# Patient Record
Sex: Female | Born: 1970 | Race: White | Hispanic: No | Marital: Married | Smoking: Never smoker
Health system: Southern US, Community
[De-identification: ages and names within clinical notes are randomized; demographics above are authoritative.]

## PROBLEM LIST (undated history)

## (undated) ENCOUNTER — Inpatient Hospital Stay (HOSPITAL_COMMUNITY): Payer: Self-pay

## (undated) DIAGNOSIS — N970 Female infertility associated with anovulation: Secondary | ICD-10-CM

## (undated) DIAGNOSIS — I10 Essential (primary) hypertension: Secondary | ICD-10-CM

## (undated) DIAGNOSIS — O24419 Gestational diabetes mellitus in pregnancy, unspecified control: Secondary | ICD-10-CM

## (undated) HISTORY — PX: DILATION AND CURETTAGE OF UTERUS: SHX78

## (undated) HISTORY — PX: TONSILLECTOMY AND ADENOIDECTOMY: SUR1326

## (undated) HISTORY — PX: INNER EAR SURGERY: SHX679

## (undated) HISTORY — DX: Female infertility associated with anovulation: N97.0

## (undated) HISTORY — DX: Gestational diabetes mellitus in pregnancy, unspecified control: O24.419

## (undated) HISTORY — PX: CHOLECYSTECTOMY: SHX55

---

## 2005-06-12 ENCOUNTER — Other Ambulatory Visit: Admission: RE | Admit: 2005-06-12 | Discharge: 2005-06-12 | Payer: Self-pay | Admitting: Obstetrics & Gynecology

## 2008-06-03 ENCOUNTER — Ambulatory Visit: Payer: Self-pay | Admitting: Physician Assistant

## 2008-06-17 ENCOUNTER — Encounter: Payer: Self-pay | Admitting: Physician Assistant

## 2008-06-17 LAB — CONVERTED CEMR LAB
ALT: 11 units/L (ref 0–35)
Albumin: 3.9 g/dL (ref 3.5–5.2)
Antibody Screen: NEGATIVE
Basophils Absolute: 0 10*3/uL (ref 0.0–0.1)
Basophils Relative: 0 % (ref 0–1)
CO2: 17 meq/L — ABNORMAL LOW (ref 19–32)
Calcium: 9.4 mg/dL (ref 8.4–10.5)
Chloride: 108 meq/L (ref 96–112)
Creatinine, Urine: 109.5 mg/dL
Eosinophils Absolute: 0.1 10*3/uL (ref 0.0–0.7)
Eosinophils Relative: 1 % (ref 0–5)
Glucose, Bld: 106 mg/dL — ABNORMAL HIGH (ref 70–99)
Hemoglobin: 14.1 g/dL (ref 12.0–15.0)
MCHC: 35 g/dL (ref 30.0–36.0)
MCV: 86.3 fL (ref 78.0–100.0)
Monocytes Absolute: 0.5 10*3/uL (ref 0.1–1.0)
Monocytes Relative: 6 % (ref 3–12)
Neutro Abs: 5.5 10*3/uL (ref 1.7–7.7)
Potassium: 4 meq/L (ref 3.5–5.3)
Protein, Ur: 84 mg/24hr (ref 50–100)
RBC: 4.67 M/uL (ref 3.87–5.11)
RDW: 14.1 % (ref 11.5–15.5)
Sodium: 136 meq/L (ref 135–145)
TSH: 1.12 microintl units/mL (ref 0.350–4.50)
Total Bilirubin: 0.5 mg/dL (ref 0.3–1.2)
Total Protein: 6.9 g/dL (ref 6.0–8.3)
Uric Acid, Serum: 4.5 mg/dL (ref 2.4–7.0)

## 2008-06-18 ENCOUNTER — Encounter: Payer: Self-pay | Admitting: Physician Assistant

## 2008-07-01 ENCOUNTER — Ambulatory Visit: Payer: Self-pay | Admitting: Physician Assistant

## 2008-07-01 LAB — CONVERTED CEMR LAB
Hemoglobin, Urine: NEGATIVE
Leukocytes, UA: NEGATIVE
Nitrite: NEGATIVE
Urine Glucose: NEGATIVE mg/dL
pH: 7 (ref 5.0–8.0)

## 2008-07-04 ENCOUNTER — Encounter: Payer: Self-pay | Admitting: Physician Assistant

## 2008-07-12 ENCOUNTER — Encounter: Payer: Self-pay | Admitting: Physician Assistant

## 2008-07-19 ENCOUNTER — Encounter: Admission: RE | Admit: 2008-07-19 | Discharge: 2008-07-19 | Payer: Self-pay | Admitting: Obstetrics & Gynecology

## 2008-07-29 ENCOUNTER — Ambulatory Visit: Payer: Self-pay | Admitting: Physician Assistant

## 2008-08-09 ENCOUNTER — Ambulatory Visit (HOSPITAL_COMMUNITY): Admission: RE | Admit: 2008-08-09 | Discharge: 2008-08-09 | Payer: Self-pay | Admitting: Obstetrics & Gynecology

## 2008-08-22 ENCOUNTER — Ambulatory Visit: Payer: Self-pay | Admitting: Family

## 2008-08-29 ENCOUNTER — Ambulatory Visit: Payer: Self-pay | Admitting: Obstetrics and Gynecology

## 2008-09-05 ENCOUNTER — Ambulatory Visit: Payer: Self-pay | Admitting: Obstetrics and Gynecology

## 2008-09-09 ENCOUNTER — Ambulatory Visit (HOSPITAL_COMMUNITY): Admission: RE | Admit: 2008-09-09 | Discharge: 2008-09-09 | Payer: Self-pay | Admitting: Obstetrics & Gynecology

## 2008-09-23 ENCOUNTER — Ambulatory Visit: Payer: Self-pay | Admitting: Physician Assistant

## 2008-10-07 ENCOUNTER — Ambulatory Visit (HOSPITAL_COMMUNITY): Admission: RE | Admit: 2008-10-07 | Discharge: 2008-10-07 | Payer: Self-pay | Admitting: Obstetrics & Gynecology

## 2008-10-07 ENCOUNTER — Ambulatory Visit: Payer: Self-pay | Admitting: Physician Assistant

## 2008-10-13 ENCOUNTER — Encounter: Payer: Self-pay | Admitting: Physician Assistant

## 2008-10-13 LAB — CONVERTED CEMR LAB
ALT: <8 units/L (ref 0–35)
AST: 12 units/L (ref 0–37)
Albumin: 3.3 g/dL — ABNORMAL LOW (ref 3.5–5.2)
BUN: 8 mg/dL (ref 6–23)
CO2: 20 meq/L (ref 19–32)
Calcium: 8.7 mg/dL (ref 8.4–10.5)
Chloride: 109 meq/L (ref 96–112)
Hemoglobin: 11.9 g/dL — ABNORMAL LOW (ref 12.0–15.0)
Platelets: 204 10*3/uL (ref 150–400)
Potassium: 4 meq/L (ref 3.5–5.3)
RDW: 14.7 % (ref 11.5–15.5)
Uric Acid, Serum: 4 mg/dL (ref 2.4–7.0)
WBC: 8.5 10*3/uL (ref 4.0–10.5)

## 2008-10-28 ENCOUNTER — Ambulatory Visit: Payer: Self-pay | Admitting: Family

## 2008-10-28 LAB — CONVERTED CEMR LAB
HCT: 36.9 % (ref 36.0–46.0)
Hemoglobin: 12 g/dL (ref 12.0–15.0)
MCV: 88.5 fL (ref 78.0–100.0)
RBC: 4.17 M/uL (ref 3.87–5.11)
WBC: 8.6 10*3/uL (ref 4.0–10.5)

## 2008-11-04 ENCOUNTER — Ambulatory Visit (HOSPITAL_COMMUNITY): Admission: RE | Admit: 2008-11-04 | Discharge: 2008-11-04 | Payer: Self-pay | Admitting: Obstetrics & Gynecology

## 2008-11-14 ENCOUNTER — Ambulatory Visit: Payer: Self-pay | Admitting: Family

## 2008-11-18 ENCOUNTER — Ambulatory Visit: Payer: Self-pay | Admitting: Obstetrics and Gynecology

## 2008-11-23 ENCOUNTER — Ambulatory Visit: Payer: Self-pay | Admitting: Obstetrics & Gynecology

## 2008-11-28 ENCOUNTER — Ambulatory Visit: Payer: Self-pay | Admitting: Family

## 2008-12-02 ENCOUNTER — Ambulatory Visit (HOSPITAL_COMMUNITY): Admission: RE | Admit: 2008-12-02 | Discharge: 2008-12-02 | Payer: Self-pay | Admitting: Obstetrics & Gynecology

## 2008-12-02 ENCOUNTER — Ambulatory Visit: Payer: Self-pay | Admitting: Family

## 2008-12-05 ENCOUNTER — Ambulatory Visit: Payer: Self-pay | Admitting: Family

## 2008-12-09 ENCOUNTER — Ambulatory Visit: Payer: Self-pay | Admitting: Obstetrics and Gynecology

## 2008-12-12 ENCOUNTER — Encounter: Payer: Self-pay | Admitting: Obstetrics and Gynecology

## 2008-12-12 ENCOUNTER — Ambulatory Visit: Payer: Self-pay | Admitting: Obstetrics & Gynecology

## 2008-12-21 ENCOUNTER — Ambulatory Visit: Payer: Self-pay | Admitting: Obstetrics & Gynecology

## 2008-12-21 ENCOUNTER — Encounter: Payer: Self-pay | Admitting: Obstetrics and Gynecology

## 2008-12-21 LAB — CONVERTED CEMR LAB
BUN: 8 mg/dL (ref 6–23)
CO2: 19 meq/L (ref 19–32)
Creatinine, Ser: 0.58 mg/dL (ref 0.40–1.20)
Glucose, Bld: 104 mg/dL — ABNORMAL HIGH (ref 70–99)
HCT: 39.9 % (ref 36.0–46.0)
Hemoglobin: 13.3 g/dL (ref 12.0–15.0)
MCV: 90.5 fL (ref 78.0–100.0)
RBC: 4.41 M/uL (ref 3.87–5.11)
Sodium: 140 meq/L (ref 135–145)
Total Bilirubin: 0.4 mg/dL (ref 0.3–1.2)
Total Protein: 6 g/dL (ref 6.0–8.3)
WBC: 9.8 10*3/uL (ref 4.0–10.5)

## 2008-12-23 ENCOUNTER — Ambulatory Visit: Payer: Self-pay | Admitting: Obstetrics and Gynecology

## 2008-12-23 ENCOUNTER — Encounter: Payer: Self-pay | Admitting: Obstetrics & Gynecology

## 2008-12-23 ENCOUNTER — Ambulatory Visit (HOSPITAL_COMMUNITY): Admission: RE | Admit: 2008-12-23 | Discharge: 2008-12-23 | Payer: Self-pay | Admitting: Obstetrics & Gynecology

## 2008-12-23 LAB — CONVERTED CEMR LAB
Collection Interval-CRCL: 24 hr
Creatinine, Urine: 100.8 mg/dL
Protein, Ur: 85 mg/24hr (ref 50–100)

## 2008-12-26 ENCOUNTER — Ambulatory Visit: Payer: Self-pay | Admitting: Family

## 2008-12-30 ENCOUNTER — Ambulatory Visit: Payer: Self-pay | Admitting: Obstetrics and Gynecology

## 2009-01-03 ENCOUNTER — Ambulatory Visit: Payer: Self-pay | Admitting: Family Medicine

## 2009-01-03 ENCOUNTER — Inpatient Hospital Stay (HOSPITAL_COMMUNITY): Admission: RE | Admit: 2009-01-03 | Discharge: 2009-01-05 | Payer: Self-pay | Admitting: Family Medicine

## 2009-01-06 ENCOUNTER — Ambulatory Visit: Payer: Self-pay | Admitting: Physician Assistant

## 2009-01-10 ENCOUNTER — Ambulatory Visit: Payer: Self-pay | Admitting: Obstetrics & Gynecology

## 2009-01-20 ENCOUNTER — Ambulatory Visit: Payer: Self-pay | Admitting: Physician Assistant

## 2009-01-21 ENCOUNTER — Encounter: Payer: Self-pay | Admitting: Physician Assistant

## 2009-02-17 ENCOUNTER — Ambulatory Visit: Payer: Self-pay | Admitting: Physician Assistant

## 2009-03-16 ENCOUNTER — Encounter: Payer: Self-pay | Admitting: Physician Assistant

## 2009-08-25 ENCOUNTER — Emergency Department (HOSPITAL_BASED_OUTPATIENT_CLINIC_OR_DEPARTMENT_OTHER): Admission: EM | Admit: 2009-08-25 | Discharge: 2009-08-25 | Payer: Self-pay | Admitting: Emergency Medicine

## 2010-09-24 ENCOUNTER — Ambulatory Visit: Admit: 2010-09-24 | Payer: Self-pay | Admitting: Obstetrics and Gynecology

## 2010-11-03 IMAGING — US US OB FOLLOW-UP
1 series · 18 of 28 positions shown · non-contrast
Comparison: none

OBSTETRICAL ULTRASOUND:
 This ultrasound was performed in The [HOSPITAL], and the [facility] report will be stored to [REDACTED] PACS.

[Series 1: us ob follow-up · 18 of 57 slices shown]
[im 1/57]
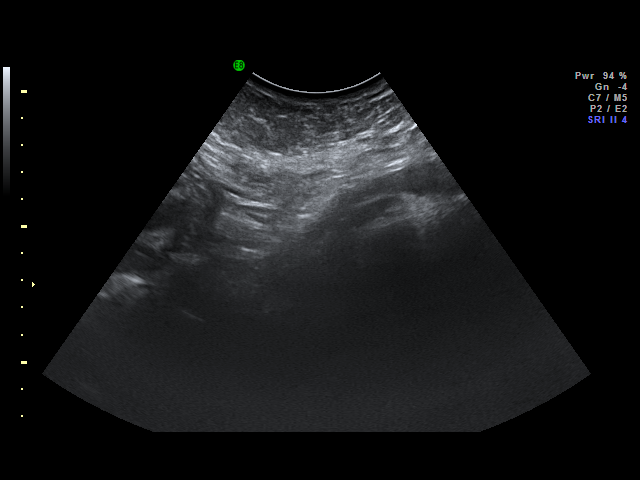
[im 5/57]
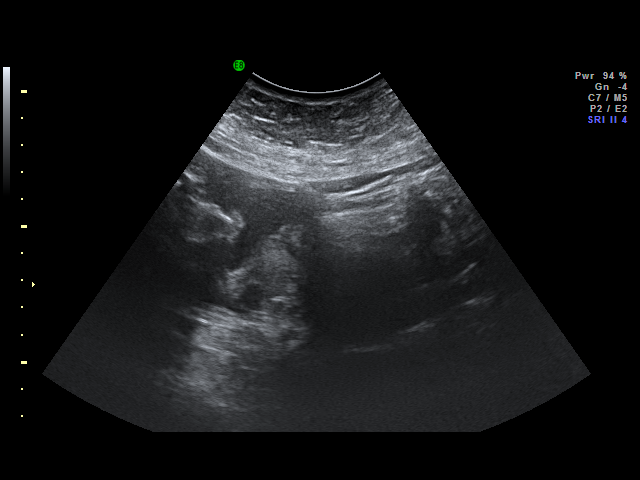
[im 7/57]
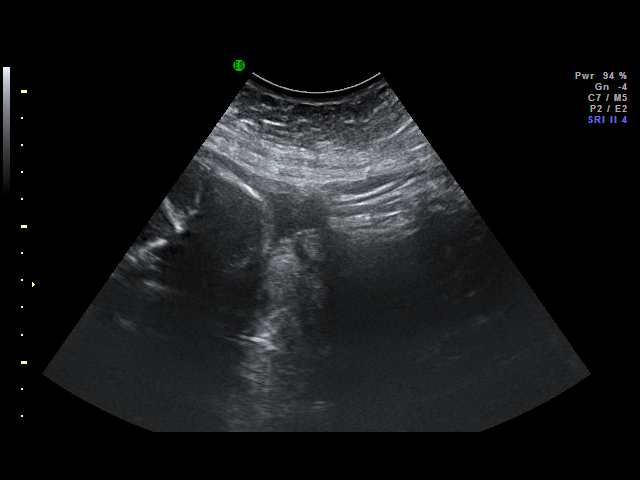
[im 11/57]
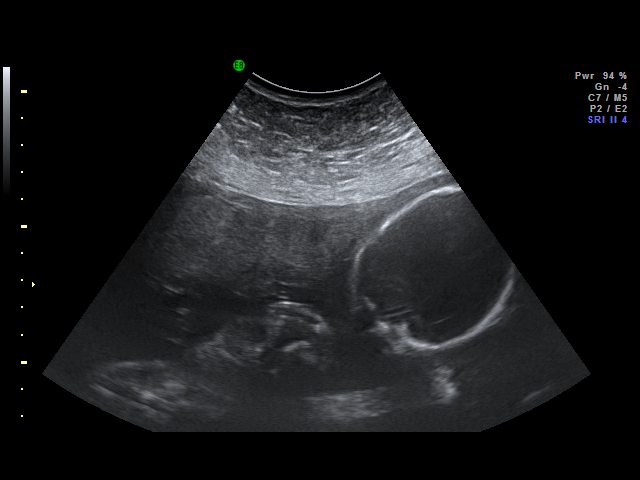
[im 15/57]
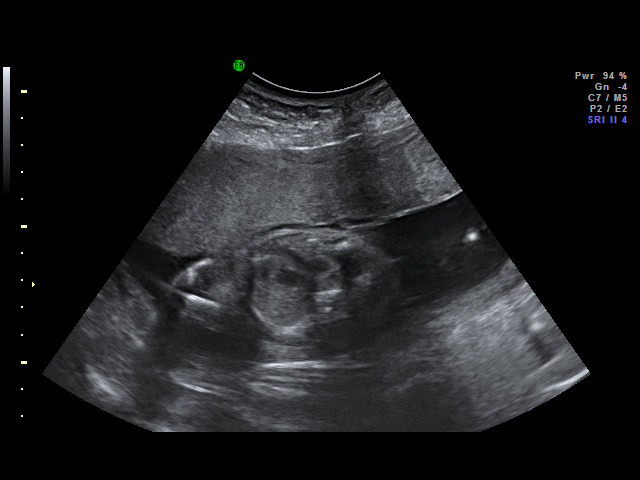
[im 17/57]
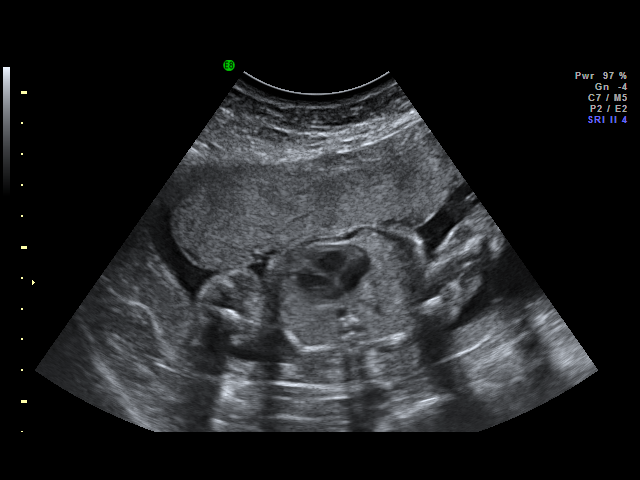
[im 21/57]
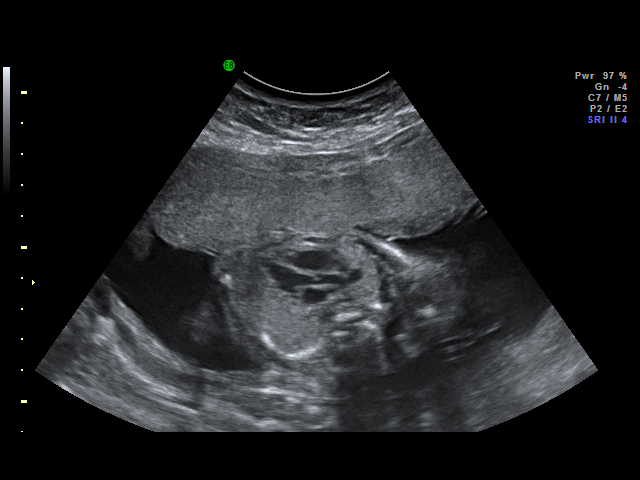
[im 23/57]
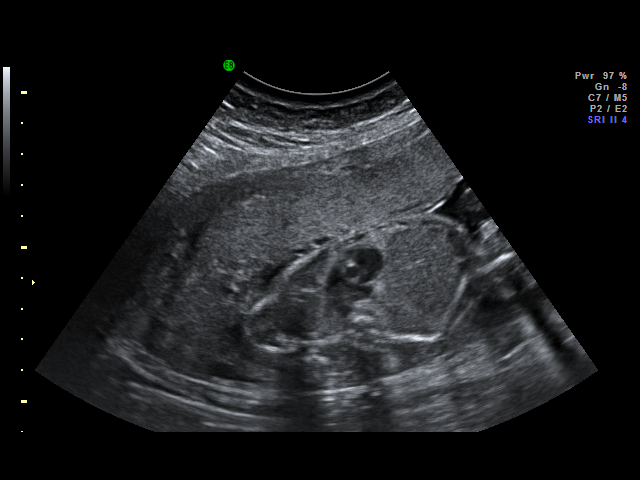
[im 27/57]
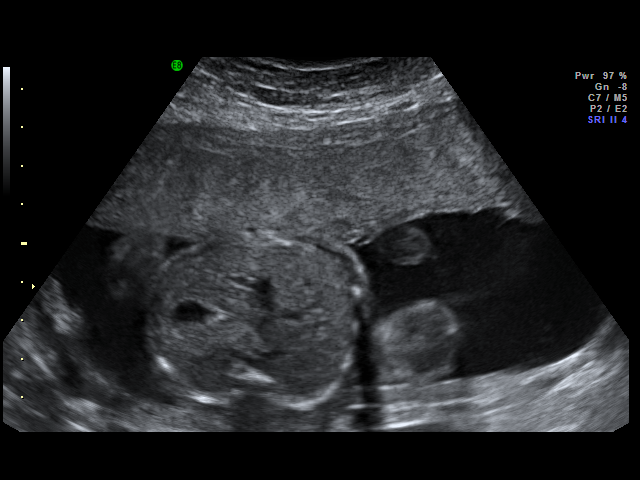
[im 30/57]
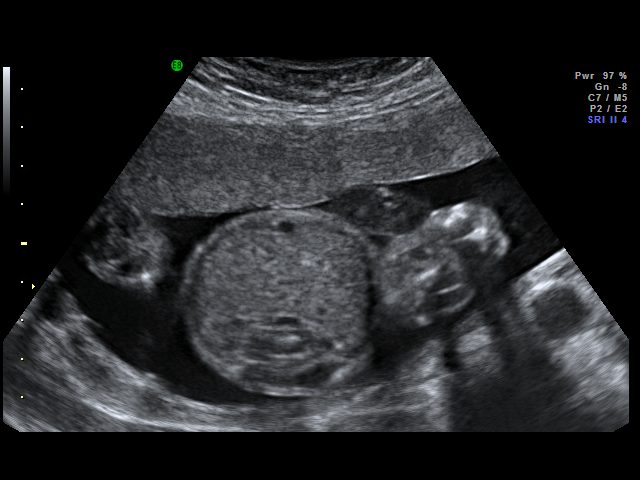
[im 34/57]
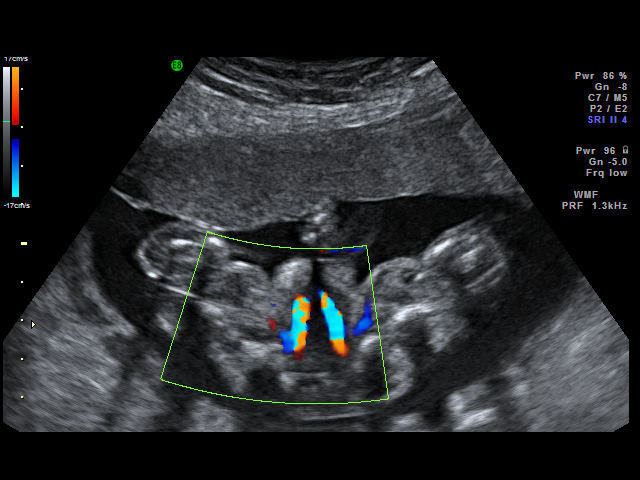
[im 36/57]
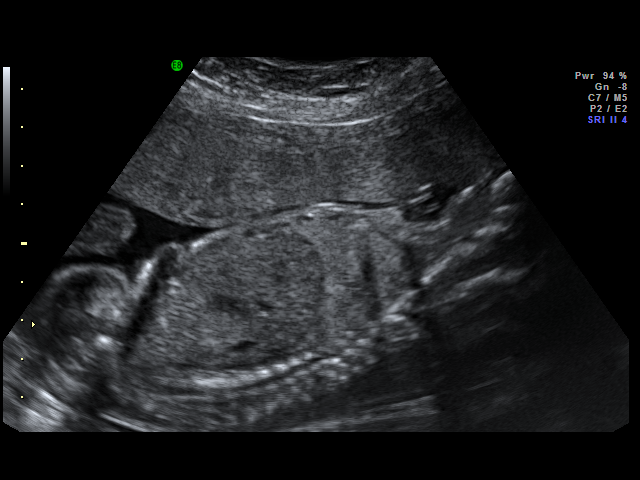
[im 40/57]
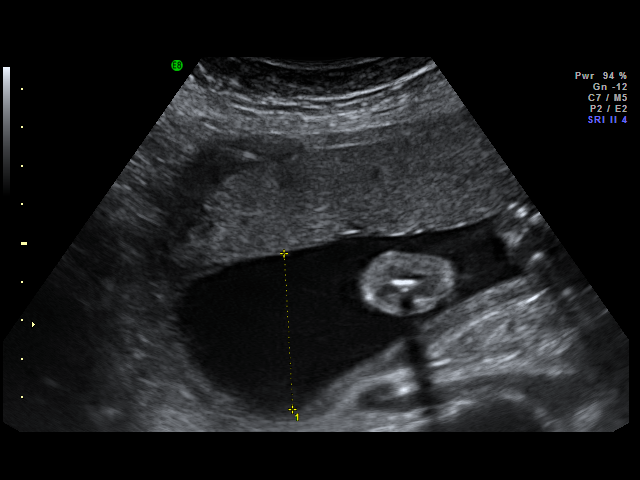
[im 44/57]
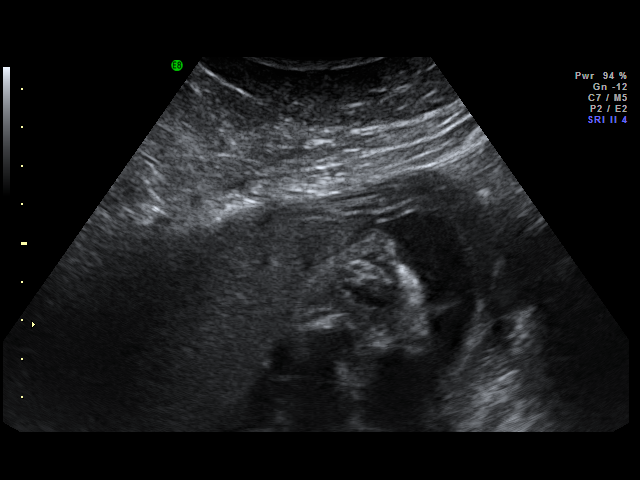
[im 46/57]
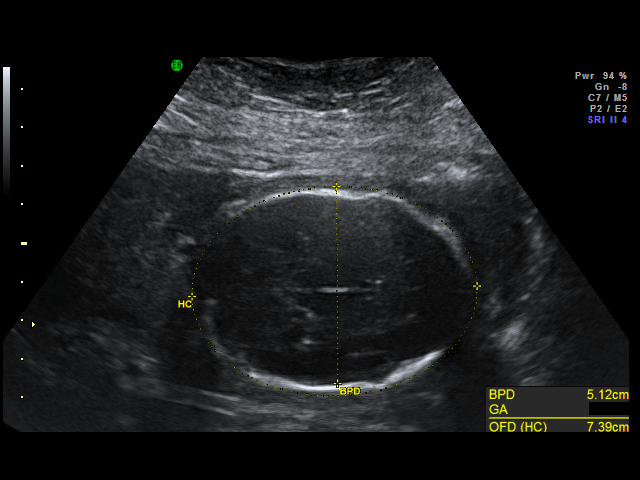
[im 50/57]
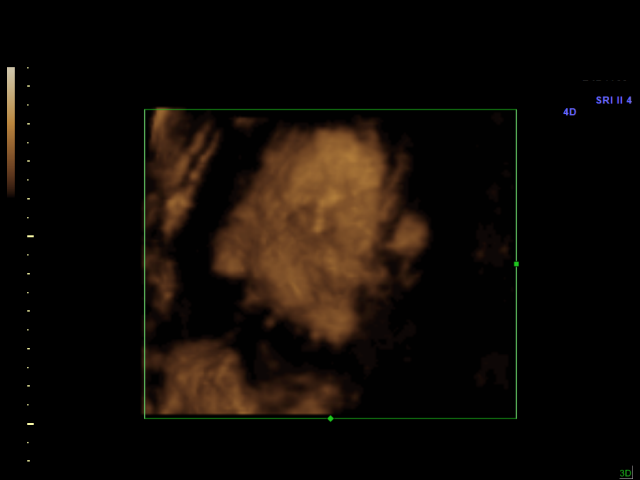
[im 52/57]
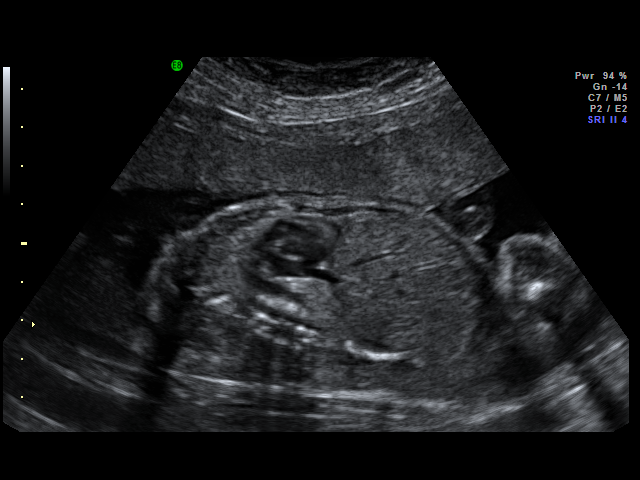
[im 57/57]
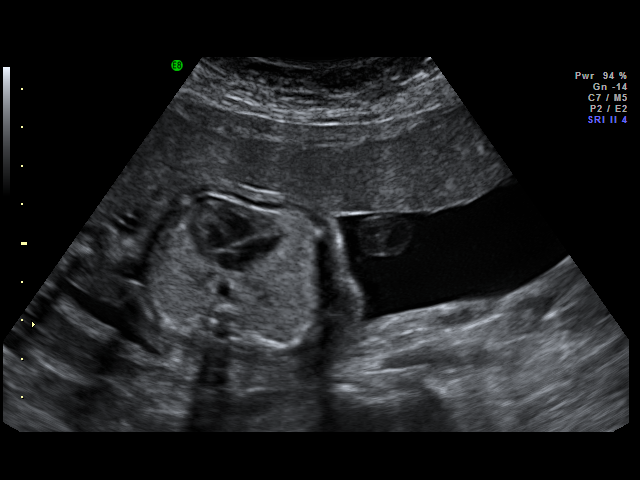

[18 of 28 positions shown; findings below may reference images not displayed]

IMPRESSION: [facility] has also been faxed to the ordering physician.

## 2010-12-04 LAB — GLUCOSE, CAPILLARY
Glucose-Capillary: 144 mg/dL — ABNORMAL HIGH (ref 70–99)
Glucose-Capillary: 93 mg/dL (ref 70–99)
Glucose-Capillary: 94 mg/dL (ref 70–99)
Glucose-Capillary: 99 mg/dL (ref 70–99)

## 2010-12-04 LAB — CBC
Hemoglobin: 12.9 g/dL (ref 12.0–15.0)
Hemoglobin: 13.5 g/dL (ref 12.0–15.0)
MCHC: 35.2 g/dL (ref 30.0–36.0)
RBC: 4.07 MIL/uL (ref 3.87–5.11)
RBC: 4.25 MIL/uL (ref 3.87–5.11)
WBC: 8.6 10*3/uL (ref 4.0–10.5)

## 2010-12-04 LAB — CROSSMATCH: ABO/RH(D): B POS

## 2010-12-04 LAB — BASIC METABOLIC PANEL
CO2: 19 mEq/L (ref 19–32)
Chloride: 109 mEq/L (ref 96–112)
GFR calc Af Amer: >60 mL/min (ref >60)
Potassium: 3.7 mEq/L (ref 3.5–5.1)
Sodium: 138 mEq/L (ref 135–145)

## 2011-01-08 NOTE — Assessment & Plan Note (Signed)
[name], [name]                 ACCOUNT NO.:  [id number]   MEDICAL RECORD NO.:  [id number]          PATIENT TYPE:  POB   LOCATION:  [facility] [place]         FACILITY:  [facility]   PHYSICIAN:  Elsie Lincoln, MD      DATE OF BIRTH:  07/18/1971   DATE OF SERVICE:  01/10/2009                                  CLINIC NOTE   The patient is a 40 year old female who is postpartum day #7 from a C-  section.  The patient is having a little drainage from her C-section  scar.  She is also complaining of engorgement and the baby unable to be  latching on to her breast.  In terms of her C-section scar, there is no  odor or pain.  Her abdomen is nontender.   On physical exam, her abdomen is soft, nontender.  No rebound.  No  guarding.  These Pfannenstiel skin incision has an approximately  centimeter and a half opening just a few millimeters deep.  This was  cleaned with hydrogen peroxide, and a small amount of silver nitrate was  placed at the edges to aid in healing.  The patient was instructed to  keep this area clean and dry.   For her breasts, I suggested pre-pumping so the baby can latch on better  and also icing in between to decrease milk production.  The patient  seems to be getting frustrated with having to pump everything and feed  in a bottle and hopefully __________ breast-feeding only and avoid the  bottles at this point.   The patient is to come back in 1 week to evaluate skin incision, come  back sooner if it is worsening.           ______________________________  Elsie Lincoln, MD     KL/MEDQ  D:  01/10/2009  T:  01/10/2009  Job:  [id number]

## 2011-01-08 NOTE — Discharge Summary (Signed)
NAMEJESSIE, [name]                 ACCOUNT NO.:  [id number]   MEDICAL RECORD NO.:  [id number]          PATIENT TYPE:  INP   LOCATION:  9145                          FACILITY:  [facility]   PHYSICIAN:  [name], M.D.   DATE OF BIRTH:  11/16/1970   DATE OF ADMISSION:  01/03/2009  DATE OF DISCHARGE:  01/05/2009                               DISCHARGE SUMMARY   ADMISSION DIAGNOSES:  Intrauterine pregnancy 39-0/7 weeks, class A2  gestational diabetes, previous C-section x2.   DISCHARGE DIAGNOSIS:  Repeat low transverse cervical (cesarean) section,  viable female infant, class A2 gestational diabetes mellitus.   HISTORY:  This is a 40 year old G7, P 2-0-4-2, who presented for  scheduled repeat LTCS.  Of note, she had a history of having had 4 early  SABs.  She had gestational hypertension and was breech with both C-  sections prior to this.  Her prenatal course was complicated by A2  gestational diabetes.  She was on glyburide and twice weekly fetal  testing.  Also, she had a thrombophilia workup in the pregnancy that was  negative, and due to her borderline BP elevation, she had a 24-hour  urine and CMP done which were normal.  She did early q.i.d. glucose  monitoring from second trimester and had good glycemic control on  glyburide 5 mg at bedtime.  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   FAMILY HISTORY:  Significant for CAD, chronic hypertension, COPD, and  breast cancer.   SOCIAL HISTORY:  She had a supportive family.  No alcohol or illicit  drugs.   She had no allergies.  Her medication at the time of admission was  glyburide 3.75 mg nightly, prenatal vitamins, and Zantac.  Her review of  systems was negative for headache, nausea, vomiting, fever, and  constipation.   HOSPITAL COURSE:  She underwent a scheduled LTCS with no complications,  had a viable female infant, Apgars 8 at one and 9 at  five minutes.  The  infant had some issues with low sugars, but was in regular newborn  nursery and was breast-feeding well at day of discharge.  Her EBL was  noted to be 1000 at C-section.  Her hemoglobin was 13.5 on admission and  12.9 on postop day #1.  Her postpartum CBGs were essentially within  normal range 87-144.  On day of discharge, she was normotensive at  136/85 and pulse 64.  Her abdomen was nontender essentially and fundus  was involuting.  Staples intact.  Calves were nontender.  She was  discharged in good condition on hospital day #2.   Her meds were prenatal vitamin 1 a day, ibuprofen 600 q.6 h. p.r.n.  cramps, Percocet 5/325 mg 1-2 q.4-6 h.  [facility] for staple removal on Jan 06, 2009, and also at 6 weeks she would go to [place].  At that visit,  she will undergo some glycemic testing and is  to get on some definitive  contraception since she at present elects abstinence or condoms.      [name], C.N.M.    ______________________________  [name]. Shawnie Dixon, M.D.    DP/MEDQ  D:  01/05/2009  T:  01/05/2009  Job:  [id number]

## 2011-01-08 NOTE — Op Note (Signed)
[name], [name]                 ACCOUNT NO.:  [id number]   MEDICAL RECORD NO.:  [id number]          PATIENT TYPE:  POB   LOCATION:  [facility]                          FACILITY:  [facility]   PHYSICIAN:  Tanya S. Shawnie Pons, M.D.   DATE OF BIRTH:  10/17/1970   DATE OF PROCEDURE:  01/03/2009  DATE OF DISCHARGE:                               OPERATIVE REPORT   PREOPERATIVE DIAGNOSIS:  Previous cesarean section x2, intrauterine  pregnancy at 39 weeks, A2/B diabetes mellitus, advanced maternal age,  history of fertility, history of multiple miscarriages, and  hypertension.   POSTOPERATIVE DIAGNOSES:  Previous cesarean section x2, intrauterine  pregnancy at 39 weeks, A2/B diabetes mellitus, advanced maternal age,  history of fertility, history of multiple miscarriages, and  hypertension.   PROCEDURE:  A repeat low transverse cesarean section.   SURGEON:  Shelbie Proctor. Shawnie Pons, MD   ASSISTANT:  None.   ANESTHESIA:  Spinal and local, [place].   FINDINGS:  A viable female infant, Apgars 9 and 10, weight 7 pounds 9  ounces.   SPECIMENS:  Placenta to Labor and Delivery.   ESTIMATED BLOOD LOSS:  1000 mL.   COMPLICATIONS:  None known.   REASON FOR PROCEDURE:  Briefly, the patient is a 40 year old gravida 7,  para 2, who has had 5 previous miscarriages, but has had 2 previous C-  sections at 39 weeks.  She is a A2/B diabetes, on glyburide at bedtime  with excellent blood sugar control and appropriately grand fetus.  The  patient required elective repeat C-section secondary to 2 prior sections  and no vaginal deliveries.   PROCEDURE IN DETAIL:  The patient was taken to the OR.  She was placed  in supine position in the left lateral tilt.  She was prepped and draped  in the usual sterile fashion.  A Foley catheter was placed in the  bladder.  After anesthesia was felt to be adequate by Allis testing, a  knife was used to make a Pfannenstiel incision through an old incision.  This incision was carried  down to the fascia which was divided in the  midline sharply.  This incision was extended laterally with Mayo  scissors.  Two Kocher clamps were then used to elevate the superior edge  of the fascia off the underlying rectus.  This was done bluntly,  laterally and sharply in the midline.  The peritoneal cavity was entered  here and then this incision was bluntly extended by pulling on either  side by the surgeon and assistant.  Alexis retractor was placed inside  the incision, the bladder was identified and then lower segment was also  identified that was very wide, and a low transverse incision was made on  the uterus.  Amniotic cavity was entered and an Allis clamp was used to  rupture clear fluid.  Infant was in a vertex presentation, was brought  out, nuchal cord x2 were reduced easily, infant was crying prior to  delivery.  Cord was clamped x2.  Infant was bulb suctioned on the  abdomen and given to awaiting peds.  The placenta  was delivered out of  the uterus without difficulty.  The uterus was cleaned with a dry lap  pad.  The edges of the uterine incision were identified and closed with  a 0 Vicryl suture in locked running fashion.  Tubes and ovaries were  observed to be normal.  The uterine incision was inspected, felt to be  hemostatic.  Abdomen cleared of clots.  Fascia closed with 0 Vicryl in a  running fashion.  Subcutaneous tissue irrigated, any bleeders  cauterized.  Skin closed using clips.  A 30 mL of 0.25% Marcaine were  injected about the incision.  All instrument, needle, and lap counts  were correct x2.  The patient was awakened and taken to recovery room in  stable condition.  Infant to Newborn Nursery also stable.      Shelbie Proctor. Shawnie Pons, M.D.  Electronically Signed     TSP/MEDQ  D:  01/03/2009  T:  01/03/2009  Job:  [id number]

## 2012-08-10 ENCOUNTER — Encounter: Payer: Self-pay | Admitting: Advanced Practice Midwife

## 2012-08-10 ENCOUNTER — Ambulatory Visit (INDEPENDENT_AMBULATORY_CARE_PROVIDER_SITE_OTHER): Payer: Self-pay | Admitting: Advanced Practice Midwife

## 2012-08-10 VITALS — BP 147/97 | Temp 97.1°F | Ht 60.0 in | Wt 198.0 lb

## 2012-08-10 DIAGNOSIS — O169 Unspecified maternal hypertension, unspecified trimester: Secondary | ICD-10-CM | POA: Insufficient documentation

## 2012-08-10 DIAGNOSIS — O34219 Maternal care for unspecified type scar from previous cesarean delivery: Secondary | ICD-10-CM | POA: Insufficient documentation

## 2012-08-10 DIAGNOSIS — O09299 Supervision of pregnancy with other poor reproductive or obstetric history, unspecified trimester: Secondary | ICD-10-CM | POA: Insufficient documentation

## 2012-08-10 DIAGNOSIS — Z8742 Personal history of other diseases of the female genital tract: Secondary | ICD-10-CM

## 2012-08-10 DIAGNOSIS — O3421 Maternal care for scar from previous cesarean delivery: Secondary | ICD-10-CM

## 2012-08-10 DIAGNOSIS — IMO0002 Reserved for concepts with insufficient information to code with codable children: Secondary | ICD-10-CM

## 2012-08-10 DIAGNOSIS — Z3480 Encounter for supervision of other normal pregnancy, unspecified trimester: Secondary | ICD-10-CM

## 2012-08-10 DIAGNOSIS — O09529 Supervision of elderly multigravida, unspecified trimester: Secondary | ICD-10-CM

## 2012-08-10 NOTE — Progress Notes (Signed)
p-86  This is a planned pregnancy through Dr Elesa Hacker.  She was stimulated with Follistim.  Last pap smear 1/12.  Pt is very pro-life

## 2012-08-10 NOTE — Progress Notes (Signed)
Subjective:    [name] is a [id number] [redacted]w[redacted]d being seen today for her first obstetrical visit.  Her obstetrical history is significant for advanced maternal age and history of infertility, SAB x5, and LTCS for 3 term deliveries. Patient does intend to breast feed. Pregnancy history fully reviewed.  Patient reports no complaints.  She had vaginal bleeding/spotting a few weeks ago followed by a normal U/S done at another facility.  This bleeding has resolved.  Filed Vitals:   08/10/12 0905 08/10/12 0935  BP: 147/97   Temp: 97.1 F (36.2 C)   Height:  5' (1.524 m)  Weight: 198 lb (89.812 kg)     HISTORY: OB History    Grav Para Term Preterm Abortions TAB SAB Ect Mult Living   9 3 3       3      # Outc Date GA Lbr Len/2nd Wgt Sex Del Anes PTL Lv   1 GRA 9/01           2 GRA 9/02           3 TRM 2/04 [redacted]w[redacted]d   F CCS EPI No Yes   4 GRA 1/07           Comments: D and C   5 GRA 6/07           Comments: D&C   6 TRM 7/08 [redacted]w[redacted]d   F CCS EPI No Yes   7 TRM 5/10 [redacted]w[redacted]d   F CCS EPI No Yes   8 GRA 2/12           Comments: D&C   9 CUR              Past Medical History  Diagnosis Date  . Infertility associated with anovulation   . Gestational diabetes    Past Surgical History  Procedure Date  . Dilation and curettage of uterus     x3  . Cholecystectomy   . Tonsillectomy and adenoidectomy   . Inner ear surgery    Family History  Problem Relation Age of Onset  . Asthma Mother   . COPD Mother   . Congestive Heart Failure Mother   . Hypertension Father   . Cancer - Other Paternal Grandmother     breast     Exam    Uterus:     Pelvic Exam:    Perineum: Normal Perineum   Vulva: normal   Vagina:  normal mucosa, normal discharge   pH:    Cervix: 0/long/high, posterior, neg CMT   Adnexa: normal adnexa and no mass, fullness, tenderness   Bony Pelvis: average  System: Breast:  normal appearance, no masses or tenderness   Skin: normal coloration and turgor, no rashes    Neurologic: oriented, normal, normal mood   Extremities: normal strength, tone, and muscle mass, ROM of all joints is normal   HEENT neck supple with midline trachea and thyroid without masses   Mouth/Teeth mucous membranes moist, pharynx normal without lesions and dental hygiene good   Neck supple and no masses   Cardiovascular: regular rate and rhythm   Respiratory:  appears well, vitals normal, no respiratory distress, acyanotic, normal RR, ear and throat exam is normal, neck free of mass or lymphadenopathy, chest clear, no wheezing, crepitations, rhonchi, normal symmetric air entry   Abdomen: soft, non-tender; bowel sounds normal; no masses,  no organomegaly   Urinary: urethral meatus normal      Assessment:    Pregnancy: [id number]  Patient Active Problem List  Diagnosis  . Supervision of normal IUP (intrauterine pregnancy) in multigravida  . History of cesarean delivery, currently pregnant  . History of miscarriage, currently pregnant  . History of infertility        Plan:     Initial labs drawn. Prenatal vitamins. Problem list reviewed and updated. Genetic Screening discussed First Screen, Integrated Screen, Quad Screen, Harmony, and Amniocentesis: declined.  Ultrasound discussed; fetal survey: requested.  Follow up in 4 weeks. 50% of 30 min visit spent on counseling and coordination of care.     LEFTWICH-KIRBY, Francis Doenges 08/10/2012

## 2012-08-11 LAB — OBSTETRIC PANEL
Antibody Screen: NEGATIVE
Basophils Relative: 0 % (ref 0–1)
Eosinophils Absolute: 0.1 10*3/uL (ref 0.0–0.7)
Eosinophils Relative: 1 % (ref 0–5)
HCT: 40.5 % (ref 36.0–46.0)
Hemoglobin: 13.6 g/dL (ref 12.0–15.0)
Lymphs Abs: 2.1 10*3/uL (ref 0.7–4.0)
MCH: 29.7 pg (ref 26.0–34.0)
MCHC: 33.6 g/dL (ref 30.0–36.0)
MCV: 88.4 fL (ref 78.0–100.0)
Monocytes Absolute: 0.6 10*3/uL (ref 0.1–1.0)
Monocytes Relative: 6 % (ref 3–12)
RBC: 4.58 MIL/uL (ref 3.87–5.11)
Rh Type: POSITIVE

## 2012-08-12 LAB — CULTURE, URINE COMPREHENSIVE
Colony Count: NO GROWTH
Organism ID, Bacteria: NO GROWTH

## 2012-08-21 ENCOUNTER — Telehealth: Payer: Self-pay | Admitting: *Deleted

## 2012-08-21 NOTE — Telephone Encounter (Signed)
Pt called stating that she had a small amount of brownish blood this AM.  She does have a small subchorionic hemorrhage that was noted on her U/S.  She denies having intercourse or any cramping.  She is leaving for the beach today and will return on Wed.  She is to return to the office on Thursday to f/u.  She is to stay of feet as much as possible.  If she starts to have heavy bleeding that is bright red she may opt to go to hospital.. She knows that there is nothing that can be done at this point if she does miscarries.  She is to continue her Prometrium as prescribed.

## 2012-08-27 ENCOUNTER — Other Ambulatory Visit (INDEPENDENT_AMBULATORY_CARE_PROVIDER_SITE_OTHER): Payer: BC Managed Care – PPO | Admitting: *Deleted

## 2012-08-27 DIAGNOSIS — O208 Other hemorrhage in early pregnancy: Secondary | ICD-10-CM

## 2012-08-27 NOTE — Progress Notes (Signed)
Pt here because she had some brown spotting last week.  She denies any cramping or pain.  She is currently taking her Prometrium and had a small subchorionic hemorrhage on previous U/S.  Today U/S showed very small [facility] and a very active fetal movement with FHT on U/S of 169 bpm.  Pt reassured and will follow up in 3 weeks or PRN.

## 2012-09-07 ENCOUNTER — Ambulatory Visit (INDEPENDENT_AMBULATORY_CARE_PROVIDER_SITE_OTHER): Payer: BC Managed Care – PPO | Admitting: Advanced Practice Midwife

## 2012-09-07 ENCOUNTER — Encounter: Payer: Self-pay | Admitting: Advanced Practice Midwife

## 2012-09-07 VITALS — BP 141/96 | Wt 198.0 lb

## 2012-09-07 DIAGNOSIS — Z23 Encounter for immunization: Secondary | ICD-10-CM

## 2012-09-07 DIAGNOSIS — Z3480 Encounter for supervision of other normal pregnancy, unspecified trimester: Secondary | ICD-10-CM

## 2012-09-07 NOTE — Progress Notes (Signed)
P=88 

## 2012-09-07 NOTE — Patient Instructions (Signed)
Pregnancy - Second Trimester The second trimester of pregnancy (3 to 6 months) is a period of rapid growth for you and your baby. At the end of the sixth month, your baby is about 9 inches long and weighs 1 1/2 pounds. You will begin to feel the baby move between 18 and 20 weeks of the pregnancy. This is called quickening. Weight gain is faster. A clear fluid (colostrum) may leak out of your breasts. You may feel small contractions of the womb (uterus). This is known as false labor or Braxton-Hicks contractions. This is like a practice for labor when the baby is ready to be born. Usually, the problems with morning sickness have usually passed by the end of your first trimester. Some women develop small dark blotches (called cholasma, mask of pregnancy) on their face that usually goes away after the baby is born. Exposure to the sun makes the blotches worse. Acne may also develop in some pregnant women and pregnant women who have acne, may find that it goes away. PRENATAL EXAMS  Blood work may continue to be done during prenatal exams. These tests are done to check on your health and the probable health of your baby. Blood work is used to follow your blood levels (hemoglobin). Anemia (low hemoglobin) is common during pregnancy. Iron and vitamins are given to help prevent this. You will also be checked for diabetes between 24 and 28 weeks of the pregnancy. Some of the previous blood tests may be repeated.  The size of the uterus is measured during each visit. This is to make sure that the baby is continuing to grow properly according to the dates of the pregnancy.  Your blood pressure is checked every prenatal visit. This is to make sure you are not getting toxemia.  Your urine is checked to make sure you do not have an infection, diabetes or protein in the urine.  Your weight is checked often to make sure gains are happening at the suggested rate. This is to ensure that both you and your baby are growing  normally.  Sometimes, an ultrasound is performed to confirm the proper growth and development of the baby. This is a test which bounces harmless sound waves off the baby so your caregiver can more accurately determine due dates. Sometimes, a specialized test is done on the amniotic fluid surrounding the baby. This test is called an amniocentesis. The amniotic fluid is obtained by sticking a needle into the belly (abdomen). This is done to check the chromosomes in instances where there is a concern about possible genetic problems with the baby. It is also sometimes done near the end of pregnancy if an early delivery is required. In this case, it is done to help make sure the baby's lungs are mature enough for the baby to live outside of the womb. CHANGES OCCURING IN THE SECOND TRIMESTER OF PREGNANCY Your body goes through many changes during pregnancy. They vary from person to person. Talk to your caregiver about changes you notice that you are concerned about.  During the second trimester, you will likely have an increase in your appetite. It is normal to have cravings for certain foods. This varies from person to person and pregnancy to pregnancy.  Your lower abdomen will begin to bulge.  You may have to urinate more often because the uterus and baby are pressing on your bladder. It is also common to get more bladder infections during pregnancy (pain with urination). You can help this by   drinking lots of fluids and emptying your bladder before and after intercourse.  You may begin to get stretch marks on your hips, abdomen, and breasts. These are normal changes in the body during pregnancy. There are no exercises or medications to take that prevent this change.  You may begin to develop swollen and bulging veins (varicose veins) in your legs. Wearing support hose, elevating your feet for 15 minutes, 3 to 4 times a day and limiting salt in your diet helps lessen the problem.  Heartburn may develop  as the uterus grows and pushes up against the stomach. Antacids recommended by your caregiver helps with this problem. Also, eating smaller meals 4 to 5 times a day helps.  Constipation can be treated with a stool softener or adding bulk to your diet. Drinking lots of fluids, vegetables, fruits, and whole grains are helpful.  Exercising is also helpful. If you have been very active up until your pregnancy, most of these activities can be continued during your pregnancy. If you have been less active, it is helpful to start an exercise program such as walking.  Hemorrhoids (varicose veins in the rectum) may develop at the end of the second trimester. Warm sitz baths and hemorrhoid cream recommended by your caregiver helps hemorrhoid problems.  Backaches may develop during this time of your pregnancy. Avoid heavy lifting, wear low heal shoes and practice good posture to help with backache problems.  Some pregnant women develop tingling and numbness of their hand and fingers because of swelling and tightening of ligaments in the wrist (carpel tunnel syndrome). This goes away after the baby is born.  As your breasts enlarge, you may have to get a bigger bra. Get a comfortable, cotton, support bra. Do not get a nursing bra until the last month of the pregnancy if you will be nursing the baby.  You may get a dark line from your belly button to the pubic area called the linea nigra.  You may develop rosy cheeks because of increase blood flow to the face.  You may develop spider looking lines of the face, neck, arms and chest. These go away after the baby is born. HOME CARE INSTRUCTIONS   It is extremely important to avoid all smoking, herbs, alcohol, and unprescribed drugs during your pregnancy. These chemicals affect the formation and growth of the baby. Avoid these chemicals throughout the pregnancy to ensure the delivery of a healthy infant.  Most of your home care instructions are the same as  suggested for the first trimester of your pregnancy. Keep your caregiver's appointments. Follow your caregiver's instructions regarding medication use, exercise and diet.  During pregnancy, you are providing food for you and your baby. Continue to eat regular, well-balanced meals. Choose foods such as meat, fish, milk and other low fat dairy products, vegetables, fruits, and whole-grain breads and cereals. Your caregiver will tell you of the ideal weight gain.  A physical sexual relationship may be continued up until near the end of pregnancy if there are no other problems. Problems could include early (premature) leaking of amniotic fluid from the membranes, vaginal bleeding, abdominal pain, or other medical or pregnancy problems.  Exercise regularly if there are no restrictions. Check with your caregiver if you are unsure of the safety of some of your exercises. The greatest weight gain will occur in the last 2 trimesters of pregnancy. Exercise will help you:  Control your weight.  Get you in shape for labor and delivery.  Lose weight   after you have the baby.  Wear a good support or jogging bra for breast tenderness during pregnancy. This may help if worn during sleep. Pads or tissues may be used in the bra if you are leaking colostrum.  Do not use hot tubs, steam rooms or saunas throughout the pregnancy.  Wear your seat belt at all times when driving. This protects you and your baby if you are in an accident.  Avoid raw meat, uncooked cheese, cat litter boxes and soil used by cats. These carry germs that can cause birth defects in the baby.  The second trimester is also a good time to visit your dentist for your dental health if this has not been done yet. Getting your teeth cleaned is OK. Use a soft toothbrush. Brush gently during pregnancy.  It is easier to loose urine during pregnancy. Tightening up and strengthening the pelvic muscles will help with this problem. Practice stopping your  urination while you are going to the bathroom. These are the same muscles you need to strengthen. It is also the muscles you would use as if you were trying to stop from passing gas. You can practice tightening these muscles up 10 times a set and repeating this about 3 times per day. Once you know what muscles to tighten up, do not perform these exercises during urination. It is more likely to contribute to an infection by backing up the urine.  Ask for help if you have financial, counseling or nutritional needs during pregnancy. Your caregiver will be able to offer counseling for these needs as well as refer you for other special needs.  Your skin may become oily. If so, wash your face with mild soap, use non-greasy moisturizer and oil or cream based makeup. MEDICATIONS AND DRUG USE IN PREGNANCY  Take prenatal vitamins as directed. The vitamin should contain 1 milligram of folic acid. Keep all vitamins out of reach of children. Only a couple vitamins or tablets containing iron may be fatal to a baby or young child when ingested.  Avoid use of all medications, including herbs, over-the-counter medications, not prescribed or suggested by your caregiver. Only take over-the-counter or prescription medicines for pain, discomfort, or fever as directed by your caregiver. Do not use aspirin.  Let your caregiver also know about herbs you may be using.  Alcohol is related to a number of birth defects. This includes fetal alcohol syndrome. All alcohol, in any form, should be avoided completely. Smoking will cause low birth rate and premature babies.  Street or illegal drugs are very harmful to the baby. They are absolutely forbidden. A baby born to an addicted mother will be addicted at birth. The baby will go through the same withdrawal an adult does. SEEK MEDICAL CARE IF:  You have any concerns or worries during your pregnancy. It is better to call with your questions if you feel they cannot wait, rather  than worry about them. SEEK IMMEDIATE MEDICAL CARE IF:   An unexplained oral temperature above 102 F (38.9 C) develops, or as your caregiver suggests.  You have leaking of fluid from the vagina (birth canal). If leaking membranes are suspected, take your temperature and tell your caregiver of this when you call.  There is vaginal spotting, bleeding, or passing clots. Tell your caregiver of the amount and how many pads are used. Light spotting in pregnancy is common, especially following intercourse.  You develop a bad smelling vaginal discharge with a change in the color from clear   to white.  You continue to feel sick to your stomach (nauseated) and have no relief from remedies suggested. You vomit blood or coffee ground-like materials.  You lose more than 2 pounds of weight or gain more than 2 pounds of weight over 1 week, or as suggested by your caregiver.  You notice swelling of your face, hands, feet, or legs.  You get exposed to German measles and have never had them.  You are exposed to fifth disease or chickenpox.  You develop belly (abdominal) pain. Round ligament discomfort is a common non-cancerous (benign) cause of abdominal pain in pregnancy. Your caregiver still must evaluate you.  You develop a bad headache that does not go away.  You develop fever, diarrhea, pain with urination, or shortness of breath.  You develop visual problems, blurry, or double vision.  You fall or are in a car accident or any kind of trauma.  There is mental or physical violence at home. Document Released: 08/06/2001 Document Revised: 11/04/2011 Document Reviewed: 02/08/2009 [employer] Patient Information 2013 [employer].  

## 2012-09-07 NOTE — Progress Notes (Signed)
Well, no c/o today. Declines genetic screen. Requests anatomy u/s around 18 weeks - ordered. Mildly elevated BP at NOB and today. Pt states BP is elevated with each pregnancy from the beginning, normal when not pregnant. Required BP med in one prior pregnancy. Also has h/o GDM and is AMA. Labs today: TSH, CMP, urine pro/creat ratio, pt will return to do early 1 hour GCT. Rev'd precautions.

## 2012-09-08 ENCOUNTER — Telehealth: Payer: Self-pay | Admitting: *Deleted

## 2012-09-08 LAB — COMPREHENSIVE METABOLIC PANEL
AST: 15 U/L (ref 0–37)
Albumin: 4 g/dL (ref 3.5–5.2)
Alkaline Phosphatase: 56 U/L (ref 39–117)
BUN: 6 mg/dL (ref 6–23)
Calcium: 9.3 mg/dL (ref 8.4–10.5)
Creat: 0.43 mg/dL — ABNORMAL LOW (ref 0.50–1.10)
Glucose, Bld: 83 mg/dL (ref 70–99)
Potassium: 3.5 mEq/L (ref 3.5–5.3)

## 2012-09-08 LAB — PROTEIN / CREATININE RATIO, URINE
Protein Creatinine Ratio: 0.04 (ref <0.15)
Total Protein, Urine: 7 mg/dL

## 2012-09-08 NOTE — Telephone Encounter (Signed)
Pt notified of normal labs

## 2012-10-05 ENCOUNTER — Ambulatory Visit (INDEPENDENT_AMBULATORY_CARE_PROVIDER_SITE_OTHER): Payer: BC Managed Care – PPO | Admitting: Advanced Practice Midwife

## 2012-10-05 VITALS — BP 153/89 | Wt 199.0 lb

## 2012-10-05 DIAGNOSIS — O1002 Pre-existing essential hypertension complicating childbirth: Secondary | ICD-10-CM

## 2012-10-05 DIAGNOSIS — O10912 Unspecified pre-existing hypertension complicating pregnancy, second trimester: Secondary | ICD-10-CM

## 2012-10-05 DIAGNOSIS — O09292 Supervision of pregnancy with other poor reproductive or obstetric history, second trimester: Secondary | ICD-10-CM

## 2012-10-05 DIAGNOSIS — O09299 Supervision of pregnancy with other poor reproductive or obstetric history, unspecified trimester: Secondary | ICD-10-CM

## 2012-10-05 DIAGNOSIS — O09522 Supervision of elderly multigravida, second trimester: Secondary | ICD-10-CM

## 2012-10-05 DIAGNOSIS — O09529 Supervision of elderly multigravida, unspecified trimester: Secondary | ICD-10-CM

## 2012-10-05 MED ORDER — LABETALOL HCL 100 MG PO TABS: 100.0000 mg | ORAL_TABLET | Freq: Two times a day (BID) | ORAL | Status: DC

## 2012-10-05 NOTE — Progress Notes (Signed)
Well, no c/o today. BP elevated, will start Labetalol 100 mg po BID now, return in 2 days for BP check. Early 1 hour GTT today. U/S scheduled at 19 weeks.

## 2012-10-05 NOTE — Progress Notes (Signed)
p-104  Early 1 hr GTt today

## 2012-10-05 NOTE — Patient Instructions (Signed)
Pregnancy - Second Trimester The second trimester is the period between 13 to 27 weeks of your pregnancy. It is important to follow your doctor's instructions. HOME CARE   Do not smoke.  Do not drink alcohol or use drugs.  Only take medicine as told by your doctor.  Take prenatal vitamins as told. The vitamin should contain 1 milligram of folic acid.  Exercise.  Eat healthy foods. Eat regular, well-balanced meals.  You can have sex (intercourse) if there are no other problems with the pregnancy.  Do not use hot tubs, steam rooms, or saunas.  Wear a seat belt while driving.  Avoid raw meat, uncooked cheese, and litter boxes and soil used by cats.  Visit your dentist. Cleanings are okay. GET HELP RIGHT AWAY IF:   You have a temperature by mouth above 102 F (38.9 C), not controlled by medicine.  Fluid is coming from your vagina.  Blood is coming from your vagina. Light spotting is common, especially after sex (intercourse).  You have a bad smelling fluid (discharge) coming from the vagina. The fluid changes from clear to white.  You still feel sick to your stomach (nauseous).  You throw up (vomit) blood.  You lose or gain more than 2 pounds (0.9 kilograms) of weight in a week, or as suggested by your doctor.  Your face, hands, feet, or legs get puffy (swell).  You get exposed to German measles and have never had them.  You get exposed to fifth disease or chickenpox.  You have belly (abdominal) pain.  You have a bad headache that will not go away.  You have watery poop (diarrhea), pain when you pee (urinate), or have shortness of breath.  You start to have problems seeing (blurry or double vision).  You fall, are in a car accident, or have any kind of trauma.  There is mental or physical violence at home.  You have any concerns or worries during your pregnancy. MAKE SURE YOU:   Understand these instructions.  Will watch your condition.  Will get help  right away if you are not doing well or get worse. Document Released: 11/06/2009 Document Revised: 11/04/2011 Document Reviewed: 11/06/2009 [employer] Patient Information 2013 [employer].   Hypertension During Pregnancy Hypertension is also called high blood pressure. Blood pressure moves blood in your body. Sometimes, the force that moves the blood becomes too strong. When you are pregnant, this condition should be watched carefully. It can cause problems for you and your baby. HOME CARE   Make and keep all of your doctor visits.  Take medicine as told by your doctor. Tell your doctor about all medicines you take.  Eat very little salt.  Exercise regularly.  Do not drink alcohol.  Do not smoke.  Do not have drinks with caffeine.  Lie on your left side when resting. GET HELP RIGHT AWAY IF:  You have bad belly (abdominal) pain.  You have sudden puffiness (swelling) in the hands, ankles, or face.  You gain 4 pounds (1.8 kilograms) or more in 1 week.  You throw up (vomit) repeatedly.  You have bleeding from the vagina.  You do not feel the baby moving as much.  You have a headache.  You have blurred or double vision.  You have muscle twitching or spasms.  You have shortness of breath.  You have blue fingernails and lips.  You have blood in your pee (urine). MAKE SURE YOU:  Understand these instructions.  Will watch your condition.    Will get help right away if you are not doing well. Document Released: 09/14/2010 Document Revised: 11/04/2011 Document Reviewed: 03/29/2011 [employer] Patient Information 2013 [employer].  

## 2012-10-06 ENCOUNTER — Telehealth: Payer: Self-pay | Admitting: *Deleted

## 2012-10-06 DIAGNOSIS — O24419 Gestational diabetes mellitus in pregnancy, unspecified control: Secondary | ICD-10-CM

## 2012-10-06 LAB — GLUCOSE TOLERANCE, 1 HOUR (50G) W/O FASTING: Glucose, 1 Hour GTT: 200 mg/dL — ABNORMAL HIGH (ref 70–140)

## 2012-10-06 MED ORDER — GLUCOSE BLOOD VI STRP: ORAL_STRIP | Status: DC

## 2012-10-06 MED ORDER — ACCU-CHEK FASTCLIX LANCETS MISC: 1.0000 | Freq: Four times a day (QID) | Status: DC

## 2012-10-06 NOTE — Telephone Encounter (Signed)
LM on voicemail to call office regarding her elevated early 1 hr GTT.  Pt had gestational diabetes with last pregnancy.  She has already been through the teaching at [facility].  She is instructed to start glucose monitoring QID and strips and lancets sent to [facility].

## 2012-10-07 ENCOUNTER — Ambulatory Visit (INDEPENDENT_AMBULATORY_CARE_PROVIDER_SITE_OTHER): Payer: BC Managed Care – PPO | Admitting: *Deleted

## 2012-10-07 VITALS — BP 119/84 | HR 84

## 2012-10-07 DIAGNOSIS — O169 Unspecified maternal hypertension, unspecified trimester: Secondary | ICD-10-CM

## 2012-10-14 ENCOUNTER — Ambulatory Visit (HOSPITAL_COMMUNITY)
Admission: RE | Admit: 2012-10-14 | Discharge: 2012-10-14 | Disposition: A | Discharge status: Home / Self Care | Payer: BC Managed Care – PPO | Source: Ambulatory Visit | Attending: Advanced Practice Midwife | Admitting: Advanced Practice Midwife

## 2012-10-14 DIAGNOSIS — O09529 Supervision of elderly multigravida, unspecified trimester: Secondary | ICD-10-CM | POA: Insufficient documentation

## 2012-10-14 DIAGNOSIS — O2620 Pregnancy care for patient with recurrent pregnancy loss, unspecified trimester: Secondary | ICD-10-CM | POA: Insufficient documentation

## 2012-10-14 DIAGNOSIS — O99810 Abnormal glucose complicating pregnancy: Secondary | ICD-10-CM | POA: Insufficient documentation

## 2012-10-14 DIAGNOSIS — Z3480 Encounter for supervision of other normal pregnancy, unspecified trimester: Secondary | ICD-10-CM

## 2012-10-19 ENCOUNTER — Ambulatory Visit (INDEPENDENT_AMBULATORY_CARE_PROVIDER_SITE_OTHER): Payer: BC Managed Care – PPO | Admitting: Advanced Practice Midwife

## 2012-10-19 VITALS — BP 135/83 | Wt 199.0 lb

## 2012-10-19 DIAGNOSIS — O162 Unspecified maternal hypertension, second trimester: Secondary | ICD-10-CM

## 2012-10-19 DIAGNOSIS — O2432 Unspecified pre-existing diabetes mellitus in childbirth: Secondary | ICD-10-CM

## 2012-10-19 DIAGNOSIS — O139 Gestational [pregnancy-induced] hypertension without significant proteinuria, unspecified trimester: Secondary | ICD-10-CM

## 2012-10-19 DIAGNOSIS — O24919 Unspecified diabetes mellitus in pregnancy, unspecified trimester: Secondary | ICD-10-CM | POA: Insufficient documentation

## 2012-10-19 DIAGNOSIS — O09529 Supervision of elderly multigravida, unspecified trimester: Secondary | ICD-10-CM

## 2012-10-19 DIAGNOSIS — O24912 Unspecified diabetes mellitus in pregnancy, second trimester: Secondary | ICD-10-CM

## 2012-10-19 MED ORDER — GLYBURIDE 5 MG PO TABS: 5.0000 mg | ORAL_TABLET | Freq: Every day | ORAL | Status: DC

## 2012-10-19 NOTE — Progress Notes (Signed)
P - 88 - Pt states BP meds has made her head itch and scalp tingle - Pt states she thinks the pain she is having is coming from [facility]

## 2012-10-19 NOTE — Progress Notes (Signed)
Well except some symphysis pubis pain, recommended maternity support brace, may try PT or chiropractor. Her husband is a physical therapist, so she'll try that first. Blood sugars almost all elevated - [place] all elevated (98-116), 2 hours 101-163 (18/27 elevated). Discussed diet, start Glyburide 5 mg PO QD, rev'd with Dr. Marice Potter. Blood pressure better today on Labetalol. Anatomy u/s normal except spine not well visualized - repeat ordered for 2 weeks. RTC 2 weeks to review blood sugars.

## 2012-10-19 NOTE — Patient Instructions (Addendum)
Pregnancy - Second Trimester °The second trimester of pregnancy (3 to 6 months) is a period of rapid growth for you and your baby. At the end of the sixth month, your baby is about 9 inches long and weighs 1 1/2 pounds. You will begin to feel the baby move between 18 and 20 weeks of the pregnancy. This is called quickening. Weight gain is faster. A clear fluid (colostrum) may leak out of your breasts. You may feel small contractions of the womb (uterus). This is known as false labor or Braxton-Hicks contractions. This is like a practice for labor when the baby is ready to be born. Usually, the problems with morning sickness have usually passed by the end of your first trimester. Some women develop small dark blotches (called cholasma, mask of pregnancy) on their face that usually goes away after the baby is born. Exposure to the sun makes the blotches worse. Acne may also develop in some pregnant women and pregnant women who have acne, may find that it goes away. °PRENATAL EXAMS °· Blood work may continue to be done during prenatal exams. These tests are done to check on your health and the probable health of your baby. Blood work is used to follow your blood levels (hemoglobin). Anemia (low hemoglobin) is common during pregnancy. Iron and vitamins are given to help prevent this. You will also be checked for diabetes between 24 and 28 weeks of the pregnancy. Some of the previous blood tests may be repeated. °· The size of the uterus is measured during each visit. This is to make sure that the baby is continuing to grow properly according to the dates of the pregnancy. °· Your blood pressure is checked every prenatal visit. This is to make sure you are not getting toxemia. °· Your urine is checked to make sure you do not have an infection, diabetes or protein in the urine. °· Your weight is checked often to make sure gains are happening at the suggested rate. This is to ensure that both you and your baby are growing  normally. °· Sometimes, an ultrasound is performed to confirm the proper growth and development of the baby. This is a test which bounces harmless sound waves off the baby so your caregiver can more accurately determine due dates. °Sometimes, a specialized test is done on the amniotic fluid surrounding the baby. This test is called an amniocentesis. The amniotic fluid is obtained by sticking a needle into the belly (abdomen). This is done to check the chromosomes in instances where there is a concern about possible genetic problems with the baby. It is also sometimes done near the end of pregnancy if an early delivery is required. In this case, it is done to help make sure the baby's lungs are mature enough for the baby to live outside of the womb. °CHANGES OCCURING IN THE SECOND TRIMESTER OF PREGNANCY °Your body goes through many changes during pregnancy. They vary from person to person. Talk to your caregiver about changes you notice that you are concerned about. °· During the second trimester, you will likely have an increase in your appetite. It is normal to have cravings for certain foods. This varies from person to person and pregnancy to pregnancy. °· Your lower abdomen will begin to bulge. °· You may have to urinate more often because the uterus and baby are pressing on your bladder. It is also common to get more bladder infections during pregnancy (pain with urination). You can help this by   drinking lots of fluids and emptying your bladder before and after intercourse. °· You may begin to get stretch marks on your hips, abdomen, and breasts. These are normal changes in the body during pregnancy. There are no exercises or medications to take that prevent this change. °· You may begin to develop swollen and bulging veins (varicose veins) in your legs. Wearing support hose, elevating your feet for 15 minutes, 3 to 4 times a day and limiting salt in your diet helps lessen the problem. °· Heartburn may develop  as the uterus grows and pushes up against the stomach. Antacids recommended by your caregiver helps with this problem. Also, eating smaller meals 4 to 5 times a day helps. °· Constipation can be treated with a stool softener or adding bulk to your diet. Drinking lots of fluids, vegetables, fruits, and whole grains are helpful. °· Exercising is also helpful. If you have been very active up until your pregnancy, most of these activities can be continued during your pregnancy. If you have been less active, it is helpful to start an exercise program such as walking. °· Hemorrhoids (varicose veins in the rectum) may develop at the end of the second trimester. Warm sitz baths and hemorrhoid cream recommended by your caregiver helps hemorrhoid problems. °· Backaches may develop during this time of your pregnancy. Avoid heavy lifting, wear low heal shoes and practice good posture to help with backache problems. °· Some pregnant women develop tingling and numbness of their hand and fingers because of swelling and tightening of ligaments in the wrist (carpel tunnel syndrome). This goes away after the baby is born. °· As your breasts enlarge, you may have to get a bigger bra. Get a comfortable, cotton, support bra. Do not get a nursing bra until the last month of the pregnancy if you will be nursing the baby. °· You may get a dark line from your belly button to the pubic area called the linea nigra. °· You may develop rosy cheeks because of increase blood flow to the face. °· You may develop spider looking lines of the face, neck, arms and chest. These go away after the baby is born. °HOME CARE INSTRUCTIONS  °· It is extremely important to avoid all smoking, herbs, alcohol, and unprescribed drugs during your pregnancy. These chemicals affect the formation and growth of the baby. Avoid these chemicals throughout the pregnancy to ensure the delivery of a healthy infant. °· Most of your home care instructions are the same as  suggested for the first trimester of your pregnancy. Keep your caregiver's appointments. Follow your caregiver's instructions regarding medication use, exercise and diet. °· During pregnancy, you are providing food for you and your baby. Continue to eat regular, well-balanced meals. Choose foods such as meat, fish, milk and other low fat dairy products, vegetables, fruits, and whole-grain breads and cereals. Your caregiver will tell you of the ideal weight gain. °· A physical sexual relationship may be continued up until near the end of pregnancy if there are no other problems. Problems could include early (premature) leaking of amniotic fluid from the membranes, vaginal bleeding, abdominal pain, or other medical or pregnancy problems. °· Exercise regularly if there are no restrictions. Check with your caregiver if you are unsure of the safety of some of your exercises. The greatest weight gain will occur in the last 2 trimesters of pregnancy. Exercise will help you: °· Control your weight. °· Get you in shape for labor and delivery. °· Lose weight   after you have the baby. °· Wear a good support or jogging bra for breast tenderness during pregnancy. This may help if worn during sleep. Pads or tissues may be used in the bra if you are leaking colostrum. °· Do not use hot tubs, steam rooms or saunas throughout the pregnancy. °· Wear your seat belt at all times when driving. This protects you and your baby if you are in an accident. °· Avoid raw meat, uncooked cheese, cat litter boxes and soil used by cats. These carry germs that can cause birth defects in the baby. °· The second trimester is also a good time to visit your dentist for your dental health if this has not been done yet. Getting your teeth cleaned is OK. Use a soft toothbrush. Brush gently during pregnancy. °· It is easier to loose urine during pregnancy. Tightening up and strengthening the pelvic muscles will help with this problem. Practice stopping your  urination while you are going to the bathroom. These are the same muscles you need to strengthen. It is also the muscles you would use as if you were trying to stop from passing gas. You can practice tightening these muscles up 10 times a set and repeating this about 3 times per day. Once you know what muscles to tighten up, do not perform these exercises during urination. It is more likely to contribute to an infection by backing up the urine. °· Ask for help if you have financial, counseling or nutritional needs during pregnancy. Your caregiver will be able to offer counseling for these needs as well as refer you for other special needs. °· Your skin may become oily. If so, wash your face with mild soap, use non-greasy moisturizer and oil or cream based makeup. °MEDICATIONS AND DRUG USE IN PREGNANCY °· Take prenatal vitamins as directed. The vitamin should contain 1 milligram of folic acid. Keep all vitamins out of reach of children. Only a couple vitamins or tablets containing iron may be fatal to a baby or young child when ingested. °· Avoid use of all medications, including herbs, over-the-counter medications, not prescribed or suggested by your caregiver. Only take over-the-counter or prescription medicines for pain, discomfort, or fever as directed by your caregiver. Do not use aspirin. °· Let your caregiver also know about herbs you may be using. °· Alcohol is related to a number of birth defects. This includes fetal alcohol syndrome. All alcohol, in any form, should be avoided completely. Smoking will cause low birth rate and premature babies. °· Street or illegal drugs are very harmful to the baby. They are absolutely forbidden. A baby born to an addicted mother will be addicted at birth. The baby will go through the same withdrawal an adult does. °SEEK MEDICAL CARE IF:  °You have any concerns or worries during your pregnancy. It is better to call with your questions if you feel they cannot wait, rather  than worry about them. °SEEK IMMEDIATE MEDICAL CARE IF:  °· An unexplained oral temperature above 102° F (38.9° C) develops, or as your caregiver suggests. °· You have leaking of fluid from the vagina (birth canal). If leaking membranes are suspected, take your temperature and tell your caregiver of this when you call. °· There is vaginal spotting, bleeding, or passing clots. Tell your caregiver of the amount and how many pads are used. Light spotting in pregnancy is common, especially following intercourse. °· You develop a bad smelling vaginal discharge with a change in the color from clear   to white. °· You continue to feel sick to your stomach (nauseated) and have no relief from remedies suggested. You vomit blood or coffee ground-like materials. °· You lose more than 2 pounds of weight or gain more than 2 pounds of weight over 1 week, or as suggested by your caregiver. °· You notice swelling of your face, hands, feet, or legs. °· You get exposed to German measles and have never had them. °· You are exposed to fifth disease or chickenpox. °· You develop belly (abdominal) pain. Round ligament discomfort is a common non-cancerous (benign) cause of abdominal pain in pregnancy. Your caregiver still must evaluate you. °· You develop a bad headache that does not go away. °· You develop fever, diarrhea, pain with urination, or shortness of breath. °· You develop visual problems, blurry, or double vision. °· You fall or are in a car accident or any kind of trauma. °· There is mental or physical violence at home. °Document Released: 08/06/2001 Document Revised: 11/04/2011 Document Reviewed: 02/08/2009 °ExitCare® Patient Information ©2013 [employer]. ° °Gestational Diabetes Mellitus °Gestational diabetes mellitus (GDM) is diabetes that occurs only during pregnancy. This happens when the body cannot properly handle the glucose (sugar) that increases in the blood after eating. During pregnancy, insulin resistance (reduced  sensitivity to insulin) occurs because of the release of hormones from the placenta. Usually, the pancreas of pregnant women produces enough insulin to overcome the resistance that occurs. However, in gestational diabetes, the insulin is there but it does not work effectively. If the resistance is severe enough that the pancreas does not produce enough insulin, extra glucose builds up in the blood.  °WHO IS AT RISK FOR DEVELOPING GESTATIONAL DIABETES? °· Women with a history of diabetes in the family. °· Women over age 25. °· Women who are overweight. °· Women in certain ethnic groups (Hispanic, African American, Native American, Asian and Pacific Islander). °WHAT CAN HAPPEN TO THE BABY? °If the mother's blood glucose is too high while she is pregnant, the extra sugar will travel through the umbilical cord to the baby. Some of the problems the baby may have are: °· Large Baby - If the baby receives too much sugar, the baby will gain more weight. This may cause the baby to be too large to be born normally (vaginally) and a Cesarean section (C-section) may be needed. °· Low Blood Glucose (hypoglycemia)  The baby makes extra insulin, in response to the extra sugar its gets from its mother. When the baby is born and no longer needs this extra insulin, the baby's blood glucose level may drop. °· Jaundice (yellow coloring of the skin and eyes)  This is fairly common in babies. It is caused from a build-up of the chemical called bilirubin. This is rarely serious, but is seen more often in babies whose mothers had gestational diabetes. °RISKS TO THE MOTHER °Women who have had gestational diabetes may be at higher risk for some problems, including: °· Preeclampsia or toxemia, which includes problems with high blood pressure. Blood pressure and protein levels in the urine must be checked frequently. °· Infections. °· Cesarean section (C-section) for delivery. °· Developing Type 2 diabetes later in life. About 30-50% will  develop diabetes later, especially if obese. °DIAGNOSIS  °The hormones that cause insulin resistance are highest at about 24-28 weeks of pregnancy. If symptoms are experienced, they are much like symptoms you would normally expect during pregnancy.  °GDM is often diagnosed using a two part method: °1. After 24-28   weeks of pregnancy, the woman drinks a glucose solution and takes a blood test. If the glucose level is high, a second test will be given. °2. Oral Glucose Tolerance Test (OGTT) which is 3 hours long  After not eating overnight, the blood glucose is checked. The woman drinks a glucose solution, and hourly blood glucose tests are taken. °If the woman has risk factors for GDM, the caregiver may test earlier than 24 weeks of pregnancy. °TREATMENT  °Treatment of GDM is directed at keeping the mother's blood glucose level normal, and may include: °· Meal planning. °· Taking insulin or other medicine to control your blood glucose level. °· Exercise. °· Keeping a daily record of the foods you eat. °· Blood glucose monitoring and keeping a record of your blood glucose levels. °· May monitor ketone levels in the urine, although this is no longer considered necessary in most pregnancies. °HOME CARE INSTRUCTIONS  °While you are pregnant: °· Follow your caregiver's advice regarding your prenatal appointments, meal planning, exercise, medicines, vitamins, blood and other tests, and physical activities. °· Keep a record of your meals, blood glucose tests, and the amount of insulin you are taking (if any). Show this to your caregiver at every prenatal visit. °· If you have GDM, you may have problems with hypoglycemia (low blood glucose). You may suspect this if you become suddenly dizzy, feel shaky, and/or weak. If you think this is happening and you have a glucose meter, try to test your blood glucose level. Follow your caregiver's advice for when and how to treat your low blood glucose. Generally, the 15:15 rule is  followed: Treat by consuming 15 grams of carbohydrates, wait 15 minutes, and recheck blood glucose. Examples of 15 grams of carbohydrates are: °· 1 cup skim or low-fat milk. °· ½ cup juice. °· 3-4 glucose tablets. °· 5-6 hard candies. °· 1 small box raisins. °· ½ cup regular soda pop. °· Practice good hygiene, to avoid infections. °· Do not smoke. °SEEK MEDICAL CARE IF:  °· You develop abnormal vaginal discharge, with or without itching. °· You become weak and tired more than expected. °· You seem to sweat a lot. °· You have a sudden increase in weight, 5 pounds or more in one week. °· You are losing weight, 3 pounds or more in a week. °· Your blood glucose level is high, and you need instructions on what to do about it. °SEEK IMMEDIATE MEDICAL CARE IF:  °· You develop a severe headache. °· You faint or pass out. °· You develop nausea and vomiting. °· You become disoriented or confused. °· You have a convulsion. °· You develop vision problems. °· You develop stomach pain. °· You develop vaginal bleeding. °· You develop uterine contractions. °· You have leaking or a gush of fluid from the vagina. °AFTER YOU HAVE THE BABY: °· Go to all of your follow-up appointments, and have blood tests as advised by your caregiver. °· Maintain a healthy lifestyle, to prevent diabetes in the future. This includes: °· Following a healthy meal plan. °· Controlling your weight. °· Getting enough exercise and proper rest. °· Do not smoke. °· Breastfeed your baby if you can. This will lower the chance of you and your baby developing diabetes later in life. °For more information about diabetes, go to the American Diabetes Association at: www.americandiabetesassociation.org. °For more information about gestational diabetes, go to the American Congress of Obstetricians and Gynecologists at: www.acog.org. °Document Released: 11/18/2000 Document Revised: 11/04/2011 Document Reviewed: 06/12/2009 °  ExitCare® Patient Information ©2013 [employer]. ° °

## 2012-10-22 ENCOUNTER — Telehealth: Payer: Self-pay | Admitting: *Deleted

## 2012-10-22 NOTE — Telephone Encounter (Signed)
Pt called stating that she is currently on Glyburide 5 mg daily.  She has been having slightly elevated fasting CBG but then dropping into the 58-73 range 1 hr post lunch.  She is going to try taking 2.5 BID and see if that evens her CBG's out.  She took the Glyburide at night without any problems.  She is going to try that time also if the BID doesn't help.  She is instructed to call if any further problems with her CBG's or call me on Monday to update status.

## 2012-10-28 ENCOUNTER — Ambulatory Visit (INDEPENDENT_AMBULATORY_CARE_PROVIDER_SITE_OTHER): Payer: BC Managed Care – PPO | Admitting: Obstetrics & Gynecology

## 2012-10-28 VITALS — BP 138/85 | Wt 196.0 lb

## 2012-10-28 DIAGNOSIS — O24912 Unspecified diabetes mellitus in pregnancy, second trimester: Secondary | ICD-10-CM

## 2012-10-28 DIAGNOSIS — O139 Gestational [pregnancy-induced] hypertension without significant proteinuria, unspecified trimester: Secondary | ICD-10-CM

## 2012-10-28 DIAGNOSIS — O162 Unspecified maternal hypertension, second trimester: Secondary | ICD-10-CM

## 2012-10-28 DIAGNOSIS — O09529 Supervision of elderly multigravida, unspecified trimester: Secondary | ICD-10-CM

## 2012-10-28 DIAGNOSIS — O2432 Unspecified pre-existing diabetes mellitus in childbirth: Secondary | ICD-10-CM

## 2012-10-28 MED ORDER — GLYBURIDE 5 MG PO TABS: ORAL_TABLET | ORAL | Status: DC

## 2012-10-28 NOTE — Progress Notes (Signed)
P - 90 - Pt is only taking 1/2 of the glyburide

## 2012-10-28 NOTE — Progress Notes (Signed)
Pt was having low CBG with 5 mg q am.  Fastings were high without meds at night.  Will start glyburide 2.5 mg every 12 hours.  Discussed timing of meds when eating.  Pt encouraged to write CBGs down in log so I can see trend easier.  24 hour baseline urine.  For rpt c/s with Dr. Marice Potter or myself.  Pt would like to know MD who is going to deliver her.

## 2012-10-29 ENCOUNTER — Ambulatory Visit (HOSPITAL_COMMUNITY)
Admission: RE | Admit: 2012-10-29 | Discharge: 2012-10-29 | Disposition: A | Discharge status: Home / Self Care | Payer: BC Managed Care – PPO | Source: Ambulatory Visit | Attending: Advanced Practice Midwife | Admitting: Advanced Practice Midwife

## 2012-10-29 DIAGNOSIS — O24912 Unspecified diabetes mellitus in pregnancy, second trimester: Secondary | ICD-10-CM

## 2012-10-29 DIAGNOSIS — O99810 Abnormal glucose complicating pregnancy: Secondary | ICD-10-CM | POA: Insufficient documentation

## 2012-10-29 DIAGNOSIS — O09529 Supervision of elderly multigravida, unspecified trimester: Secondary | ICD-10-CM | POA: Insufficient documentation

## 2012-10-29 DIAGNOSIS — Z3689 Encounter for other specified antenatal screening: Secondary | ICD-10-CM | POA: Insufficient documentation

## 2012-10-29 DIAGNOSIS — O2620 Pregnancy care for patient with recurrent pregnancy loss, unspecified trimester: Secondary | ICD-10-CM | POA: Insufficient documentation

## 2012-10-29 DIAGNOSIS — O162 Unspecified maternal hypertension, second trimester: Secondary | ICD-10-CM

## 2012-10-30 ENCOUNTER — Encounter: Payer: Self-pay | Admitting: Advanced Practice Midwife

## 2012-11-03 ENCOUNTER — Encounter: Payer: BC Managed Care – PPO | Admitting: Obstetrics & Gynecology

## 2012-11-05 ENCOUNTER — Telehealth: Payer: Self-pay | Admitting: *Deleted
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
## 2012-11-10 ENCOUNTER — Encounter: Payer: Self-pay | Admitting: Obstetrics & Gynecology

## 2012-11-10 ENCOUNTER — Ambulatory Visit (INDEPENDENT_AMBULATORY_CARE_PROVIDER_SITE_OTHER): Payer: BC Managed Care – PPO | Admitting: Obstetrics & Gynecology

## 2012-11-10 DIAGNOSIS — O139 Gestational [pregnancy-induced] hypertension without significant proteinuria, unspecified trimester: Secondary | ICD-10-CM

## 2012-11-10 DIAGNOSIS — O09529 Supervision of elderly multigravida, unspecified trimester: Secondary | ICD-10-CM

## 2012-11-10 DIAGNOSIS — O24919 Unspecified diabetes mellitus in pregnancy, unspecified trimester: Secondary | ICD-10-CM

## 2012-11-10 DIAGNOSIS — O3421 Maternal care for scar from previous cesarean delivery: Secondary | ICD-10-CM

## 2012-11-10 DIAGNOSIS — O34219 Maternal care for unspecified type scar from previous cesarean delivery: Secondary | ICD-10-CM

## 2012-11-10 DIAGNOSIS — O169 Unspecified maternal hypertension, unspecified trimester: Secondary | ICD-10-CM

## 2012-11-10 DIAGNOSIS — E119 Type 2 diabetes mellitus without complications: Secondary | ICD-10-CM

## 2012-11-10 NOTE — Progress Notes (Signed)
Routine OB visit. Good FM. Denies VB, ROM, CTX. Sugars are ok. She has been checking 1 hour post prandials and getting 120s-130s. I have suggested that she check them 2 hours pp.

## 2012-11-10 NOTE — Progress Notes (Signed)
p-99  Pt is doing her 24 hr urine today so no urine available for Protein or Glucose

## 2012-11-11 LAB — COMPREHENSIVE METABOLIC PANEL
Albumin: 3.7 g/dL (ref 3.5–5.2)
BUN: 7 mg/dL (ref 6–23)
Calcium: 9.7 mg/dL (ref 8.4–10.5)
Chloride: 105 mEq/L (ref 96–112)
Creat: 0.54 mg/dL (ref 0.50–1.10)
Glucose, Bld: 91 mg/dL (ref 70–99)
Potassium: 4 mEq/L (ref 3.5–5.3)

## 2012-11-11 LAB — CREATININE CLEARANCE, URINE, 24 HOUR
Creatinine, 24H Ur: 1332 mg/d (ref 700–1800)
Creatinine, Urine: 121.1 mg/dL

## 2012-11-12 ENCOUNTER — Telehealth: Payer: Self-pay | Admitting: *Deleted

## 2012-11-12 NOTE — Telephone Encounter (Signed)
I called patient to check to see if she missed a void at all during her 24 hour urine - she adv she got all of them. Will call pt back once protein comes back.

## 2012-11-13 ENCOUNTER — Telehealth: Payer: Self-pay | Admitting: *Deleted

## 2012-11-13 DIAGNOSIS — R05 Cough: Secondary | ICD-10-CM

## 2012-11-13 LAB — PROTEIN, URINE, 24 HOUR: Protein, 24H Urine: 66 mg/d (ref 50–100)

## 2012-11-13 MED ORDER — HYDROCOD POLST-CHLORPHEN POLST 10-8 MG/5ML PO LQCR: 5.0000 mL | Freq: Two times a day (BID) | ORAL | Status: DC | PRN

## 2012-11-13 NOTE — Telephone Encounter (Signed)
Erroneous encounter

## 2012-11-13 NOTE — Telephone Encounter (Signed)
Pt called requesting a RX for cough med.  She has tried OTC without any luck.  It is a dry hacking cough and she denies any fever.  Per verbal order Georges Mouse CNM called Tussionex into the pharmacy.

## 2012-11-16 ENCOUNTER — Encounter: Payer: Self-pay | Admitting: Obstetrics & Gynecology

## 2012-11-25 ENCOUNTER — Other Ambulatory Visit: Payer: Self-pay | Admitting: Obstetrics & Gynecology

## 2012-11-25 ENCOUNTER — Ambulatory Visit (INDEPENDENT_AMBULATORY_CARE_PROVIDER_SITE_OTHER): Payer: BC Managed Care – PPO | Admitting: Obstetrics & Gynecology

## 2012-11-25 ENCOUNTER — Encounter: Payer: Self-pay | Admitting: Obstetrics & Gynecology

## 2012-11-25 VITALS — BP 133/75 | Wt 198.0 lb

## 2012-11-25 DIAGNOSIS — Z3480 Encounter for supervision of other normal pregnancy, unspecified trimester: Secondary | ICD-10-CM

## 2012-11-25 DIAGNOSIS — E119 Type 2 diabetes mellitus without complications: Secondary | ICD-10-CM

## 2012-11-25 DIAGNOSIS — O2432 Unspecified pre-existing diabetes mellitus in childbirth: Secondary | ICD-10-CM

## 2012-11-25 MED ORDER — GLYBURIDE 2.5 MG PO TABS: ORAL_TABLET | ORAL | Status: DC

## 2012-11-25 NOTE — Progress Notes (Signed)
Pt CBG improved.  Fastings are still high (90s-100s).  Will increase glyburide to 1 1/2 tabs of the 2.5 mg (3.75 mg) at nght.  Daytime sugars are mostly less than 120.  Will maintain at 2.5 mg po q am.  Need [place] for growth at 28 weeks.  Pt will call if CBG drop on new dose of glyburide.

## 2012-11-25 NOTE — Progress Notes (Signed)
p-88 

## 2012-12-01 ENCOUNTER — Telehealth: Payer: Self-pay | Admitting: *Deleted

## 2012-12-01 NOTE — Telephone Encounter (Signed)
Pt called in to give update on fasting blood sugar levels. States the reading was 75 this morning but still going up about 103 some mornings. She wanted to give this report and see if there is anything that needs to be changed.

## 2012-12-02 ENCOUNTER — Telehealth: Payer: Self-pay | Admitting: *Deleted

## 2012-12-02 NOTE — Telephone Encounter (Signed)
Spoke to pt about checking glucose levels during the night - she is to set her alarm between 2-3am and check it then check fasting levels when she wakes up - she will call back after doing this for about 3 days and let us know what the readings are

## 2012-12-02 NOTE — Telephone Encounter (Signed)
Called pt to speak about her concern of the high fasting glucose readings she has gotten recently - [facility] for her to call me back

## 2012-12-09 ENCOUNTER — Ambulatory Visit (INDEPENDENT_AMBULATORY_CARE_PROVIDER_SITE_OTHER): Payer: BC Managed Care – PPO | Admitting: Obstetrics & Gynecology

## 2012-12-09 ENCOUNTER — Encounter: Payer: Self-pay | Admitting: Obstetrics & Gynecology

## 2012-12-09 VITALS — BP 133/82 | Temp 97.1°F | Wt 201.0 lb

## 2012-12-09 DIAGNOSIS — Z3490 Encounter for supervision of normal pregnancy, unspecified, unspecified trimester: Secondary | ICD-10-CM

## 2012-12-09 DIAGNOSIS — Z3480 Encounter for supervision of other normal pregnancy, unspecified trimester: Secondary | ICD-10-CM

## 2012-12-09 LAB — CBC WITH DIFFERENTIAL/PLATELET
Basophils Absolute: 0 10*3/uL (ref 0.0–0.1)
Lymphocytes Relative: 15 % (ref 12–46)
Lymphs Abs: 1.4 10*3/uL (ref 0.7–4.0)
Neutro Abs: 7.3 10*3/uL (ref 1.7–7.7)
Neutrophils Relative %: 78 % — ABNORMAL HIGH (ref 43–77)
Platelets: 222 10*3/uL (ref 150–400)
RBC: 3.76 MIL/uL — ABNORMAL LOW (ref 3.87–5.11)
RDW: 14.7 % (ref 11.5–15.5)
WBC: 9.4 10*3/uL (ref 4.0–10.5)

## 2012-12-09 LAB — HIV ANTIBODY (ROUTINE TESTING W REFLEX): HIV: NONREACTIVE

## 2012-12-09 NOTE — Progress Notes (Signed)
Pt dropping at 2 am at times in 50s and 60.  Will decrease night time dose to 1 tablet and see if fastings decrease (fastings are sometimes high 90s to 100s.  Almost all pp are <120.  Will schedule c/s on July 8th with Dr. Marice Potter or myself.  Cbc RPR today.  Rh positive.

## 2012-12-09 NOTE — Progress Notes (Signed)
p-90  Fasting CBG better if pt eats a snack around 2 AM

## 2012-12-10 LAB — RPR

## 2012-12-16 ENCOUNTER — Ambulatory Visit (HOSPITAL_COMMUNITY): Payer: BC Managed Care – PPO

## 2012-12-23 ENCOUNTER — Encounter: Payer: Self-pay | Admitting: Obstetrics & Gynecology

## 2012-12-23 ENCOUNTER — Ambulatory Visit (INDEPENDENT_AMBULATORY_CARE_PROVIDER_SITE_OTHER): Payer: BC Managed Care – PPO | Admitting: Obstetrics & Gynecology

## 2012-12-23 VITALS — BP 136/78 | Wt 199.0 lb

## 2012-12-23 DIAGNOSIS — O24919 Unspecified diabetes mellitus in pregnancy, unspecified trimester: Secondary | ICD-10-CM

## 2012-12-23 DIAGNOSIS — O139 Gestational [pregnancy-induced] hypertension without significant proteinuria, unspecified trimester: Secondary | ICD-10-CM

## 2012-12-23 DIAGNOSIS — O169 Unspecified maternal hypertension, unspecified trimester: Secondary | ICD-10-CM

## 2012-12-23 DIAGNOSIS — E119 Type 2 diabetes mellitus without complications: Secondary | ICD-10-CM

## 2012-12-23 DIAGNOSIS — O3421 Maternal care for scar from previous cesarean delivery: Secondary | ICD-10-CM

## 2012-12-23 DIAGNOSIS — O09529 Supervision of elderly multigravida, unspecified trimester: Secondary | ICD-10-CM

## 2012-12-23 DIAGNOSIS — O34219 Maternal care for unspecified type scar from previous cesarean delivery: Secondary | ICD-10-CM

## 2012-12-23 NOTE — Progress Notes (Signed)
Routine visit. Good FM. No OB problems. She has been advised to use OTC tagamet/zantac because Tums is not relieving her heartburn. Her fbs are all about 100. 2 hour PPs are all less than 120. She understands that as the placenta grows, her need for DM meds will increase. She has been advised that I will probably increase her hs glyburide. I will get a growth u/s for size greater than dates.

## 2012-12-23 NOTE — Progress Notes (Signed)
p-93 

## 2012-12-30 ENCOUNTER — Ambulatory Visit (HOSPITAL_COMMUNITY): Payer: BC Managed Care – PPO

## 2012-12-30 ENCOUNTER — Ambulatory Visit (HOSPITAL_COMMUNITY)
Admission: RE | Admit: 2012-12-30 | Discharge: 2012-12-30 | Disposition: A | Discharge status: Home / Self Care | Payer: BC Managed Care – PPO | Source: Ambulatory Visit | Attending: Obstetrics & Gynecology | Admitting: Obstetrics & Gynecology

## 2012-12-30 DIAGNOSIS — O2620 Pregnancy care for patient with recurrent pregnancy loss, unspecified trimester: Secondary | ICD-10-CM | POA: Insufficient documentation

## 2012-12-30 DIAGNOSIS — O24919 Unspecified diabetes mellitus in pregnancy, unspecified trimester: Secondary | ICD-10-CM

## 2012-12-30 DIAGNOSIS — O99810 Abnormal glucose complicating pregnancy: Secondary | ICD-10-CM | POA: Insufficient documentation

## 2012-12-30 DIAGNOSIS — O3660X0 Maternal care for excessive fetal growth, unspecified trimester, not applicable or unspecified: Secondary | ICD-10-CM | POA: Insufficient documentation

## 2013-01-06 ENCOUNTER — Ambulatory Visit (INDEPENDENT_AMBULATORY_CARE_PROVIDER_SITE_OTHER): Payer: BC Managed Care – PPO | Admitting: Obstetrics & Gynecology

## 2013-01-06 VITALS — BP 128/83 | Wt 198.0 lb

## 2013-01-06 DIAGNOSIS — O24919 Unspecified diabetes mellitus in pregnancy, unspecified trimester: Secondary | ICD-10-CM

## 2013-01-06 DIAGNOSIS — E119 Type 2 diabetes mellitus without complications: Secondary | ICD-10-CM

## 2013-01-06 DIAGNOSIS — Z3480 Encounter for supervision of other normal pregnancy, unspecified trimester: Secondary | ICD-10-CM

## 2013-01-06 DIAGNOSIS — O24913 Unspecified diabetes mellitus in pregnancy, third trimester: Secondary | ICD-10-CM

## 2013-01-06 MED ORDER — METFORMIN HCL 500 MG PO TABS: 500.0000 mg | ORAL_TABLET | Freq: Every day | ORAL | Status: DC

## 2013-01-06 NOTE — Progress Notes (Signed)
p-91  Increase in dizziness which is random not from a sitting to standing

## 2013-01-06 NOTE — Progress Notes (Signed)
Fastings are mildly elevated at 93-111.  We have tried increasing glyburide in past at night, but caused lows at 3 am.  Will try adding metfromin at night.  Last [place] 76% growth with nml fluid.  Will start 2x week testing at 32 weeks.

## 2013-01-11 ENCOUNTER — Ambulatory Visit (HOSPITAL_COMMUNITY)
Admission: RE | Admit: 2013-01-11 | Discharge: 2013-01-11 | Disposition: A | Discharge status: Home / Self Care | Payer: BC Managed Care – PPO | Source: Ambulatory Visit | Attending: Obstetrics & Gynecology | Admitting: Obstetrics & Gynecology

## 2013-01-11 ENCOUNTER — Other Ambulatory Visit: Payer: Self-pay | Admitting: Obstetrics & Gynecology

## 2013-01-11 VITALS — BP 117/72 | HR 97 | Wt 201.0 lb

## 2013-01-11 DIAGNOSIS — E119 Type 2 diabetes mellitus without complications: Secondary | ICD-10-CM

## 2013-01-11 DIAGNOSIS — O2620 Pregnancy care for patient with recurrent pregnancy loss, unspecified trimester: Secondary | ICD-10-CM | POA: Insufficient documentation

## 2013-01-11 DIAGNOSIS — O09293 Supervision of pregnancy with other poor reproductive or obstetric history, third trimester: Secondary | ICD-10-CM

## 2013-01-11 DIAGNOSIS — O99810 Abnormal glucose complicating pregnancy: Secondary | ICD-10-CM | POA: Insufficient documentation

## 2013-01-11 DIAGNOSIS — O34219 Maternal care for unspecified type scar from previous cesarean delivery: Secondary | ICD-10-CM

## 2013-01-11 DIAGNOSIS — O3660X0 Maternal care for excessive fetal growth, unspecified trimester, not applicable or unspecified: Secondary | ICD-10-CM | POA: Insufficient documentation

## 2013-01-11 DIAGNOSIS — O163 Unspecified maternal hypertension, third trimester: Secondary | ICD-10-CM

## 2013-01-11 NOTE — Progress Notes (Signed)
[name]  was seen today for an ultrasound appointment.  See full report in [facility].  Impression: Single IUP at 31 6/7 weeks BPP 8/10 (-2 for NR NST) Normal amniotic fluid volume  Recommendations: Continue 2x weekly NSTs with weekly AFIs as currently scheduled.  Alpha Gula, MD

## 2013-01-12 NOTE — Addendum Note (Signed)
Encounter addended by: Alessandra Bevels. Chase Picket, RN on: 01/12/2013 10:30 AM<BR>     Documentation filed: Charges VN

## 2013-01-13 ENCOUNTER — Other Ambulatory Visit: Payer: Self-pay | Admitting: Obstetrics & Gynecology

## 2013-01-13 DIAGNOSIS — O09529 Supervision of elderly multigravida, unspecified trimester: Secondary | ICD-10-CM

## 2013-01-19 ENCOUNTER — Ambulatory Visit (HOSPITAL_COMMUNITY)
Admission: RE | Admit: 2013-01-19 | Discharge: 2013-01-19 | Disposition: A | Discharge status: Home / Self Care | Payer: BC Managed Care – PPO | Source: Ambulatory Visit | Attending: Obstetrics & Gynecology | Admitting: Obstetrics & Gynecology

## 2013-01-19 DIAGNOSIS — O2620 Pregnancy care for patient with recurrent pregnancy loss, unspecified trimester: Secondary | ICD-10-CM | POA: Insufficient documentation

## 2013-01-19 DIAGNOSIS — O163 Unspecified maternal hypertension, third trimester: Secondary | ICD-10-CM

## 2013-01-19 DIAGNOSIS — O09529 Supervision of elderly multigravida, unspecified trimester: Secondary | ICD-10-CM | POA: Insufficient documentation

## 2013-01-19 DIAGNOSIS — O10019 Pre-existing essential hypertension complicating pregnancy, unspecified trimester: Secondary | ICD-10-CM | POA: Insufficient documentation

## 2013-01-19 DIAGNOSIS — O99810 Abnormal glucose complicating pregnancy: Secondary | ICD-10-CM | POA: Insufficient documentation

## 2013-01-19 DIAGNOSIS — O24913 Unspecified diabetes mellitus in pregnancy, third trimester: Secondary | ICD-10-CM

## 2013-01-19 DIAGNOSIS — O3660X0 Maternal care for excessive fetal growth, unspecified trimester, not applicable or unspecified: Secondary | ICD-10-CM | POA: Insufficient documentation

## 2013-01-21 ENCOUNTER — Encounter: Payer: BC Managed Care – PPO | Admitting: Obstetrics & Gynecology

## 2013-01-22 ENCOUNTER — Ambulatory Visit (INDEPENDENT_AMBULATORY_CARE_PROVIDER_SITE_OTHER): Payer: BC Managed Care – PPO | Admitting: Obstetrics and Gynecology

## 2013-01-22 VITALS — BP 132/81 | Wt 201.0 lb

## 2013-01-22 DIAGNOSIS — O99810 Abnormal glucose complicating pregnancy: Secondary | ICD-10-CM

## 2013-01-22 DIAGNOSIS — O24419 Gestational diabetes mellitus in pregnancy, unspecified control: Secondary | ICD-10-CM

## 2013-01-22 NOTE — Patient Instructions (Signed)
Gestational Diabetes Mellitus Gestational diabetes mellitus, often simply referred to as gestational diabetes, is a type of diabetes that some women develop during pregnancy. In gestational diabetes, the pancreas does not make enough insulin (a hormone), the cells are less responsive to the insulin that is made (insulin resistance), or both.Normally, insulin moves sugars from food into the tissue cells. The tissue cells use the sugars for energy. The lack of insulin or the lack of normal response to insulin causes excess sugars to build up in the blood instead of going into the tissue cells. As a result, high blood sugar (hyperglycemia) develops. The effect of high sugar (glucose) levels can cause many complications.  RISK FACTORS You have an increased chance of developing gestational diabetes if you have a family history of diabetes and also have one or more of the following risk factors:  A body mass index over 30 (obesity).  A previous pregnancy with gestational diabetes.  An older age at the time of pregnancy. If blood glucose levels are kept in the normal range during pregnancy, women can have a healthy pregnancy. If your blood glucose levels are not well controlled, there may be risks to you, your unborn baby (fetus), your labor and delivery, or your newborn baby.  SYMPTOMS  If symptoms are experienced, they are much like symptoms you would normally expect during pregnancy. The symptoms of gestational diabetes include:   Increased thirst (polydipsia).  Increased urination (polyuria).  Increased urination during the night (nocturia).  Weight loss. This weight loss may be rapid.  Frequent, recurring infections.  Tiredness (fatigue).  Weakness.  Vision changes, such as blurred vision.  Fruity smell to your breath.  Abdominal pain. DIAGNOSIS Diabetes is diagnosed when blood glucose levels are increased. Your blood glucose level may be checked by one or more of the following  blood tests:  A fasting blood glucose test. You will not be allowed to eat for at least 8 hours before a blood sample is taken.  A random blood glucose test. Your blood glucose is checked at any time of the day regardless of when you ate.  A hemoglobin A1c blood glucose test. A hemoglobin A1c test provides information about blood glucose control over the previous 3 months.  An oral glucose tolerance test (OGTT). Your blood glucose is measured after you have not eaten (fasted) for 1 3 hours and then after you drink a glucose-containing beverage. Since the hormones that cause insulin resistance are highest at about 24 28 weeks of a pregnancy, an OGTT is usually performed during that time. If you have risk factors for gestational diabetes, your caregiver may test you for gestational diabetes earlier than 24 weeks of pregnancy. TREATMENT   You will need to take diabetes medicine or insulin daily to keep blood glucose levels in the desired range.  You will need to match insulin dosing with exercise and healthy food choices. The treatment goal is to maintain the before meal (preprandial), bedtime, and overnight blood glucose level at 60 99 mg/dL during pregnancy. The treatment goal is to further maintain peak after meal blood sugar (postprandial glucose) level at 100 140 mg/dL.  HOME CARE INSTRUCTIONS   Have your hemoglobin A1c level checked twice a year.  Perform daily blood glucose monitoring as directed by your caregiver. It is common to perform frequent blood glucose monitoring.  Monitor urine ketones when you are ill and as directed by your caregiver.  Take your diabetes medicine and insulin as directed by your caregiver to   maintain your blood glucose level in the desired range.  Never run out of diabetes medicine or insulin. It is needed every day.  Adjust insulin based on your intake of carbohydrates. Carbohydrates can raise blood glucose levels but need to be included in your diet.  Carbohydrates provide vitamins, minerals, and fiber which are an essential part of a healthy diet. Carbohydrates are found in fruits, vegetables, whole grains, dairy products, legumes, and foods containing added sugars.    Eat healthy foods. Alternate 3 meals with 3 snacks.  Maintain a healthy weight gain. The usual total expected weight gain varies according to your prepregnancy body mass index (BMI).  Carry a medical alert card or wear your medical alert jewelry.  Carry a 15 gram carbohydrate snack with you at all times to treat low blood glucose (hypoglycemia). Some examples of 15 gram carbohydrate snacks include:  Glucose tablets, 3 or 4   Glucose gel, 15 gram tube  Raisins, 2 tablespoons (24 g)  Jelly beans, 6  Animal crackers, 8  Fruit juice, regular soda, or low fat milk, 4 ounces (120 mL)  Gummy treats, 9    Recognize hypoglycemia. Hypoglycemia during pregnancy occurs with blood glucose levels of 60 mg/dL and below. The risk for hypoglycemia increases when fasting or skipping meals, during or after intense exercise, and during sleep. Hypoglycemia symptoms can include:  Tremors or shakes.  Decreased ability to concentrate.  Sweating.  Increased heart rate.  Headache.  Dry mouth.  Hunger.  Irritability.  Anxiety.  Restless sleep.  Altered speech or coordination.  Confusion.  Treat hypoglycemia promptly. If you are alert and able to safely swallow, follow the 15:15 rule:  Take 15 20 grams of rapid-acting glucose or carbohydrate. Rapid-acting options include glucose gel, glucose tablets, or 4 ounces (120 mL) of fruit juice, regular soda, or low fat milk.  Check your blood glucose level 15 minutes after taking the glucose.   Take 15 20 grams more of glucose if the repeat blood glucose level is still 70 mg/dL or below.  Eat a meal or snack within 1 hour once blood glucose levels return to normal.  Be alert to polyuria and polydipsia which are early  signs of hyperglycemia. An early awareness of hyperglycemia allows for prompt treatment. Treat hyperglycemia as directed by your caregiver.  Engage in at least 30 minutes of physical activity a day or as directed by your caregiver. Ten minutes of physical activity timed 30 minutes after each meal is encouraged to control postprandial blood glucose levels.  Adjust your insulin dosing and food intake as needed if you start a new exercise or sport.  Follow your sick day plan at any time you are unable to eat or drink as usual.  Avoid tobacco and alcohol use.  Follow up with your caregiver regularly.  Follow the advice of your caregiver regarding your prenatal and post-delivery (postpartum) appointments, meal planning, exercise, medicines, vitamins, blood tests, other medical tests, and physical activities.  Perform daily skin and foot care. Examine your skin and feet daily for cuts, bruises, redness, nail problems, bleeding, blisters, or sores.  Brush your teeth and gums at least twice a day and floss at least once a day. Follow up with your dentist regularly.  Schedule an eye exam during the first trimester of your pregnancy or as directed by your caregiver.  Share your diabetes management plan with your workplace or school.  Stay up-to-date with immunizations.  Learn to manage stress.  Obtain ongoing diabetes education   and support as needed. SEEK MEDICAL CARE IF:   You are unable to eat food or drink fluids for more than 6 hours.  You have nausea and vomiting for more than 6 hours.  You have a blood glucose level of 200 mg/dL and you have ketones in your urine.  There is a change in mental status.  You develop vision problems.  You have a persistent headache.  You have upper abdominal pain or discomfort.  You develop an additional serious illness.  You have diarrhea for more than 6 hours.  You have been sick or have had a fever for a couple of days and are not getting  better. SEEK IMMEDIATE MEDICAL CARE IF:   You have difficulty breathing.  You no longer feel the baby moving.  You are bleeding or have discharge from your vagina.  You start having premature contractions or labor. MAKE SURE YOU:  Understand these instructions.  Will watch your condition.  Will get help right away if you are not doing well or get worse. Document Released: 11/18/2000 Document Revised: 05/06/2012 Document Reviewed: 03/10/2012 ExitCare Patient Information 2014 [employer].  

## 2013-01-22 NOTE — Progress Notes (Signed)
CBGs improved with addition of Metformin, all within range except isolated 96 fasting today. Doing well with diet. Will increase walking. Concerned re cost of [place] at [facility] and will schedule next time with Diane. ?cephalic today. Start 2x/wk testing

## 2013-01-22 NOTE — Progress Notes (Signed)
p-93 

## 2013-01-25 ENCOUNTER — Ambulatory Visit (HOSPITAL_COMMUNITY): Payer: BC Managed Care – PPO

## 2013-01-25 ENCOUNTER — Ambulatory Visit (HOSPITAL_COMMUNITY): Admission: RE | Admit: 2013-01-25 | Payer: BC Managed Care – PPO | Source: Ambulatory Visit

## 2013-01-26 ENCOUNTER — Encounter: Payer: Self-pay | Admitting: *Deleted

## 2013-01-26 ENCOUNTER — Ambulatory Visit (INDEPENDENT_AMBULATORY_CARE_PROVIDER_SITE_OTHER): Payer: BC Managed Care – PPO | Admitting: *Deleted

## 2013-01-26 VITALS — BP 130/80

## 2013-01-26 DIAGNOSIS — O24419 Gestational diabetes mellitus in pregnancy, unspecified control: Secondary | ICD-10-CM

## 2013-01-26 DIAGNOSIS — O99810 Abnormal glucose complicating pregnancy: Secondary | ICD-10-CM

## 2013-01-26 NOTE — Progress Notes (Signed)
NST today F/U with Diane Day on Friday for NST and AFI

## 2013-01-29 ENCOUNTER — Ambulatory Visit (INDEPENDENT_AMBULATORY_CARE_PROVIDER_SITE_OTHER): Payer: BC Managed Care – PPO | Admitting: *Deleted

## 2013-01-29 ENCOUNTER — Encounter: Payer: Self-pay | Admitting: *Deleted

## 2013-01-29 VITALS — BP 118/71

## 2013-01-29 DIAGNOSIS — O24913 Unspecified diabetes mellitus in pregnancy, third trimester: Secondary | ICD-10-CM

## 2013-01-29 DIAGNOSIS — O09523 Supervision of elderly multigravida, third trimester: Secondary | ICD-10-CM

## 2013-01-29 DIAGNOSIS — O169 Unspecified maternal hypertension, unspecified trimester: Secondary | ICD-10-CM

## 2013-01-29 DIAGNOSIS — O163 Unspecified maternal hypertension, third trimester: Secondary | ICD-10-CM

## 2013-01-29 DIAGNOSIS — O139 Gestational [pregnancy-induced] hypertension without significant proteinuria, unspecified trimester: Secondary | ICD-10-CM

## 2013-01-29 DIAGNOSIS — O24919 Unspecified diabetes mellitus in pregnancy, unspecified trimester: Secondary | ICD-10-CM

## 2013-01-29 DIAGNOSIS — O99810 Abnormal glucose complicating pregnancy: Secondary | ICD-10-CM

## 2013-01-29 DIAGNOSIS — O24419 Gestational diabetes mellitus in pregnancy, unspecified control: Secondary | ICD-10-CM

## 2013-01-29 DIAGNOSIS — O09529 Supervision of elderly multigravida, unspecified trimester: Secondary | ICD-10-CM

## 2013-01-29 NOTE — Progress Notes (Addendum)
NST reviewed and reactive.  

## 2013-01-29 NOTE — Progress Notes (Signed)
P = 91 

## 2013-02-01 ENCOUNTER — Ambulatory Visit (HOSPITAL_COMMUNITY): Payer: BC Managed Care – PPO

## 2013-02-01 ENCOUNTER — Other Ambulatory Visit (HOSPITAL_COMMUNITY): Payer: BC Managed Care – PPO

## 2013-02-02 ENCOUNTER — Ambulatory Visit (INDEPENDENT_AMBULATORY_CARE_PROVIDER_SITE_OTHER): Payer: BC Managed Care – PPO | Admitting: Obstetrics and Gynecology

## 2013-02-02 ENCOUNTER — Encounter: Payer: Self-pay | Admitting: Obstetrics and Gynecology

## 2013-02-02 VITALS — BP 132/80 | Wt 204.0 lb

## 2013-02-02 DIAGNOSIS — O09523 Supervision of elderly multigravida, third trimester: Secondary | ICD-10-CM

## 2013-02-02 DIAGNOSIS — O09293 Supervision of pregnancy with other poor reproductive or obstetric history, third trimester: Secondary | ICD-10-CM

## 2013-02-02 DIAGNOSIS — O24913 Unspecified diabetes mellitus in pregnancy, third trimester: Secondary | ICD-10-CM

## 2013-02-02 DIAGNOSIS — O163 Unspecified maternal hypertension, third trimester: Secondary | ICD-10-CM

## 2013-02-02 DIAGNOSIS — O139 Gestational [pregnancy-induced] hypertension without significant proteinuria, unspecified trimester: Secondary | ICD-10-CM

## 2013-02-02 DIAGNOSIS — O09299 Supervision of pregnancy with other poor reproductive or obstetric history, unspecified trimester: Secondary | ICD-10-CM

## 2013-02-02 DIAGNOSIS — O3421 Maternal care for scar from previous cesarean delivery: Secondary | ICD-10-CM

## 2013-02-02 DIAGNOSIS — O34219 Maternal care for unspecified type scar from previous cesarean delivery: Secondary | ICD-10-CM

## 2013-02-02 DIAGNOSIS — O24919 Unspecified diabetes mellitus in pregnancy, unspecified trimester: Secondary | ICD-10-CM

## 2013-02-02 DIAGNOSIS — O09529 Supervision of elderly multigravida, unspecified trimester: Secondary | ICD-10-CM

## 2013-02-02 NOTE — Progress Notes (Signed)
CBGs majority within range with one fasting of 112 and one postprandial of 151. Patient doing well and without any obstetrical complaints. FM/PTL precautions reviewed. Cultures next visit. NST today. F/U NST with Diane on 6/12

## 2013-02-02 NOTE — Progress Notes (Signed)
p-84 

## 2013-02-02 NOTE — Progress Notes (Signed)
NST reviewed and reactive.  

## 2013-02-04 ENCOUNTER — Ambulatory Visit (INDEPENDENT_AMBULATORY_CARE_PROVIDER_SITE_OTHER): Payer: BC Managed Care – PPO | Admitting: *Deleted

## 2013-02-04 ENCOUNTER — Other Ambulatory Visit: Payer: Self-pay | Admitting: *Deleted

## 2013-02-04 VITALS — BP 129/74

## 2013-02-04 DIAGNOSIS — O163 Unspecified maternal hypertension, third trimester: Secondary | ICD-10-CM

## 2013-02-04 DIAGNOSIS — O24913 Unspecified diabetes mellitus in pregnancy, third trimester: Secondary | ICD-10-CM

## 2013-02-04 DIAGNOSIS — O139 Gestational [pregnancy-induced] hypertension without significant proteinuria, unspecified trimester: Secondary | ICD-10-CM

## 2013-02-04 DIAGNOSIS — I10 Essential (primary) hypertension: Secondary | ICD-10-CM

## 2013-02-04 DIAGNOSIS — O24919 Unspecified diabetes mellitus in pregnancy, unspecified trimester: Secondary | ICD-10-CM

## 2013-02-04 DIAGNOSIS — O169 Unspecified maternal hypertension, unspecified trimester: Secondary | ICD-10-CM

## 2013-02-04 MED ORDER — LABETALOL HCL 100 MG PO TABS: 100.0000 mg | ORAL_TABLET | Freq: Two times a day (BID) | ORAL | Status: DC

## 2013-02-04 NOTE — Progress Notes (Addendum)
P = 92 

## 2013-02-04 NOTE — Progress Notes (Signed)
Patient ID: [name], female   DOB: Jan 19, 1971, 42 y.o.   MRN: [id number] NST reviewed and reactive.

## 2013-02-04 NOTE — Telephone Encounter (Signed)
RX  RF authorization OK'd for Labetalol 100 mg

## 2013-02-08 ENCOUNTER — Other Ambulatory Visit (HOSPITAL_COMMUNITY): Payer: BC Managed Care – PPO

## 2013-02-08 ENCOUNTER — Ambulatory Visit (HOSPITAL_COMMUNITY): Payer: BC Managed Care – PPO

## 2013-02-09 ENCOUNTER — Ambulatory Visit (INDEPENDENT_AMBULATORY_CARE_PROVIDER_SITE_OTHER): Payer: BC Managed Care – PPO | Admitting: Obstetrics & Gynecology

## 2013-02-09 VITALS — BP 130/76 | Wt 204.0 lb

## 2013-02-09 DIAGNOSIS — O99810 Abnormal glucose complicating pregnancy: Secondary | ICD-10-CM

## 2013-02-09 DIAGNOSIS — Z3480 Encounter for supervision of other normal pregnancy, unspecified trimester: Secondary | ICD-10-CM

## 2013-02-09 DIAGNOSIS — O09529 Supervision of elderly multigravida, unspecified trimester: Secondary | ICD-10-CM

## 2013-02-09 DIAGNOSIS — O24419 Gestational diabetes mellitus in pregnancy, unspecified control: Secondary | ICD-10-CM

## 2013-02-09 NOTE — Progress Notes (Signed)
p-88 

## 2013-02-09 NOTE — Progress Notes (Signed)
NST 130 with reactivity and moderate variability. CBGs very well controlled with metformin and glyburide.  Continue 2x week testing.  Fetal kick counts reviewed.

## 2013-02-11 ENCOUNTER — Encounter: Payer: Self-pay | Admitting: *Deleted

## 2013-02-11 ENCOUNTER — Ambulatory Visit (INDEPENDENT_AMBULATORY_CARE_PROVIDER_SITE_OTHER): Payer: BC Managed Care – PPO | Admitting: *Deleted

## 2013-02-11 VITALS — BP 129/80

## 2013-02-11 DIAGNOSIS — O24919 Unspecified diabetes mellitus in pregnancy, unspecified trimester: Secondary | ICD-10-CM

## 2013-02-11 DIAGNOSIS — O24913 Unspecified diabetes mellitus in pregnancy, third trimester: Secondary | ICD-10-CM

## 2013-02-11 NOTE — Progress Notes (Signed)
P = 97   Baby is Frank breech presentation today- pt is scheduled for Rpt C/S on 03/02/13.

## 2013-02-12 LAB — CULTURE, BETA STREP (GROUP B ONLY)

## 2013-02-15 ENCOUNTER — Ambulatory Visit (HOSPITAL_COMMUNITY): Payer: BC Managed Care – PPO

## 2013-02-15 ENCOUNTER — Other Ambulatory Visit (HOSPITAL_COMMUNITY): Payer: BC Managed Care – PPO

## 2013-02-15 NOTE — Progress Notes (Signed)
6/19 NST reviewed and reactive 

## 2013-02-16 ENCOUNTER — Ambulatory Visit (INDEPENDENT_AMBULATORY_CARE_PROVIDER_SITE_OTHER): Payer: BC Managed Care – PPO | Admitting: Obstetrics & Gynecology

## 2013-02-16 ENCOUNTER — Encounter: Payer: Self-pay | Admitting: Obstetrics & Gynecology

## 2013-02-16 VITALS — BP 138/94 | Wt 206.0 lb

## 2013-02-16 DIAGNOSIS — O09529 Supervision of elderly multigravida, unspecified trimester: Secondary | ICD-10-CM

## 2013-02-16 DIAGNOSIS — O09299 Supervision of pregnancy with other poor reproductive or obstetric history, unspecified trimester: Secondary | ICD-10-CM

## 2013-02-16 DIAGNOSIS — O24419 Gestational diabetes mellitus in pregnancy, unspecified control: Secondary | ICD-10-CM

## 2013-02-16 DIAGNOSIS — O09293 Supervision of pregnancy with other poor reproductive or obstetric history, third trimester: Secondary | ICD-10-CM

## 2013-02-16 DIAGNOSIS — O99810 Abnormal glucose complicating pregnancy: Secondary | ICD-10-CM

## 2013-02-16 DIAGNOSIS — O3421 Maternal care for scar from previous cesarean delivery: Secondary | ICD-10-CM

## 2013-02-16 DIAGNOSIS — O34219 Maternal care for unspecified type scar from previous cesarean delivery: Secondary | ICD-10-CM

## 2013-02-16 DIAGNOSIS — O163 Unspecified maternal hypertension, third trimester: Secondary | ICD-10-CM

## 2013-02-16 DIAGNOSIS — O09523 Supervision of elderly multigravida, third trimester: Secondary | ICD-10-CM

## 2013-02-16 DIAGNOSIS — O139 Gestational [pregnancy-induced] hypertension without significant proteinuria, unspecified trimester: Secondary | ICD-10-CM

## 2013-02-16 NOTE — Progress Notes (Signed)
P=93 

## 2013-02-16 NOTE — Progress Notes (Signed)
Routine OB visit. Good FM. NST- She denies headache, visual changes, or RUQ pain. DTRs 2+, no pedal edema. Pre eclampsia precautions given. CBC, CMETA, 24 hour urine ordered. Excellent sugars with the addition of metformin.

## 2013-02-17 ENCOUNTER — Encounter (HOSPITAL_COMMUNITY): Payer: Self-pay | Admitting: *Deleted

## 2013-02-17 ENCOUNTER — Ambulatory Visit (INDEPENDENT_AMBULATORY_CARE_PROVIDER_SITE_OTHER): Payer: BC Managed Care – PPO | Admitting: Obstetrics & Gynecology

## 2013-02-17 ENCOUNTER — Inpatient Hospital Stay (HOSPITAL_COMMUNITY)
Admission: AD | Admit: 2013-02-17 | Discharge: 2013-02-17 | Disposition: A | Discharge status: Home / Self Care | Payer: BC Managed Care – PPO | Source: Ambulatory Visit | Attending: Obstetrics & Gynecology | Admitting: Obstetrics & Gynecology

## 2013-02-17 VITALS — BP 169/99 | Wt 206.0 lb

## 2013-02-17 DIAGNOSIS — R51 Headache: Secondary | ICD-10-CM | POA: Insufficient documentation

## 2013-02-17 DIAGNOSIS — O163 Unspecified maternal hypertension, third trimester: Secondary | ICD-10-CM

## 2013-02-17 DIAGNOSIS — O10019 Pre-existing essential hypertension complicating pregnancy, unspecified trimester: Secondary | ICD-10-CM | POA: Insufficient documentation

## 2013-02-17 DIAGNOSIS — Z3480 Encounter for supervision of other normal pregnancy, unspecified trimester: Secondary | ICD-10-CM

## 2013-02-17 DIAGNOSIS — O99810 Abnormal glucose complicating pregnancy: Secondary | ICD-10-CM | POA: Insufficient documentation

## 2013-02-17 DIAGNOSIS — O139 Gestational [pregnancy-induced] hypertension without significant proteinuria, unspecified trimester: Secondary | ICD-10-CM

## 2013-02-17 LAB — COMPREHENSIVE METABOLIC PANEL
AST: 15 U/L (ref 0–37)
CO2: 20 mEq/L (ref 19–32)
Chloride: 103 mEq/L (ref 96–112)
Creatinine, Ser: 0.58 mg/dL (ref 0.50–1.10)
GFR calc Af Amer: >90 mL/min (ref >90)
GFR calc non Af Amer: >90 mL/min (ref >90)
Glucose, Bld: 84 mg/dL (ref 70–99)
Total Bilirubin: 0.3 mg/dL (ref 0.3–1.2)

## 2013-02-17 LAB — CBC
HCT: 34.1 % — ABNORMAL LOW (ref 36.0–46.0)
Hemoglobin: 11.5 g/dL — ABNORMAL LOW (ref 12.0–15.0)
MCV: 85.9 fL (ref 78.0–100.0)
Platelets: 179 10*3/uL (ref 150–400)
RBC: 3.97 MIL/uL (ref 3.87–5.11)
WBC: 10.3 10*3/uL (ref 4.0–10.5)

## 2013-02-17 LAB — PROTEIN / CREATININE RATIO, URINE: Total Protein, Urine: 8.9 mg/dL

## 2013-02-17 NOTE — MAU Provider Note (Signed)
History      CSN: [id number]  Arrival date and time: 02/17/13 0953   None     No chief complaint on file.  HPI [name] is a 42 YOF, [id number], [redacted]w[redacted]d with a CHTN and medication controlled gestational diabetes in current pregnancy presenting for [facility] labs and r/o preeclampsia. Was seen by Dr Marice Potter in clinic this morning. BP 149/99 at home, 169/99 in clinic. Reports constant dull HA this morning, did not taking medication. Denies vision changes, RUQ pain, leg swelling. Complains of some hand swelling. + fetal movement within last hr. Denies ctx, vaginal bleeding, vaginal discharge, leakage of fluids.  Past Medical History  Diagnosis Date  . Infertility associated with anovulation   . Gestational diabetes     Past Surgical History  Procedure Laterality Date  . Dilation and curettage of uterus      x3  . Cholecystectomy    . Tonsillectomy and adenoidectomy    . Inner ear surgery      Family History  Problem Relation Age of Onset  . Asthma Mother   . COPD Mother   . Congestive Heart Failure Mother   . Hypertension Father   . Cancer - Other Paternal Grandmother     breast    History  Substance Use Topics  . Smoking status: Never Smoker   . Smokeless tobacco: Never Used  . Alcohol Use: No    Allergies: No Known Allergies  Prescriptions prior to admission  Medication Sig Dispense Refill  . glyBURIDE (DIABETA) 5 MG tablet Take 2.5 mg by mouth 2 (two) times daily.      Marland Kitchen labetalol (NORMODYNE) 100 MG tablet Take 1 tablet (100 mg total) by mouth 2 (two) times daily.  60 tablet  3  . metFORMIN (GLUCOPHAGE) 500 MG tablet Take 1 tablet (500 mg total) by mouth at bedtime.  30 tablet  3  . Prenatal Vit-Fe Fumarate-FA (PRENATAL MULTIVITAMIN) TABS Take 1 tablet by mouth daily at 12 noon.      Marland Kitchen ACCU-CHEK FASTCLIX LANCETS MISC 1 Device by Percutaneous route 4 (four) times daily.  100 each  12  . glucose blood test strip Accucheck Aveva strips.  Pt to check CBG QID  100 each  12     Review of Systems  Eyes: Negative for blurred vision and double vision.  Respiratory: Negative for shortness of breath.   Cardiovascular: Negative for chest pain.  Neurological: Positive for headaches.   Physical Exam   Blood pressure 135/98, pulse 85, temperature 98.2 F (36.8 C), temperature source Oral, resp. rate 18, last menstrual period 06/02/2012.  Physical Exam  Constitutional: She appears well-developed and well-nourished.  Cardiovascular: Normal rate and regular rhythm.   Respiratory: Effort normal. No respiratory distress.  GI: Soft. Distention: gravid but not otherwise distended. There is no tenderness. There is no rebound and no guarding.  Musculoskeletal: She exhibits no edema (slight hand edema).  Skin: Skin is warm and dry. No erythema.    MAU Course  Procedures  MDM  R/o preeclampsia, laboratory assessment  Assessment and Plan  Ax: [redacted]w[redacted]d, [id number], CHTN, A2GDM, r/o preeclampsia  Plan: - Will obtain laboratory workup for Pre-eclampsia.  Ardis Hughs 02/17/2013, 10:57 AM   I have seen the patient above and agree with the note.  My physical exam and A/P are below.  Physical Exam: Gen: well appearing, NAD. Heart: RRR. Lungs: CTAB. Abd: gravid but otherwise soft, nontender to palpation Ext: no appreciable lower extremity edema bilaterally Neuro: no focal deficits  on exam   FHR: baseline 145, mod variability, 15x15 accels, no decels Toco: irritability noted  A/P:   42 y.o. [id number] at [redacted]w[redacted]d with CHTN and A2GDM who presents with headache and elevated BP. - Will obtain CBC, CMP and Urine Protein/Creatinine ratio - BP's currently stable (<140/90) - Case discussed with Dr. Debroah Loop.   1400 - UPC = 0.09, CBC and CMP unremarkable (no LFT elevation and no thrombocytopenia).  Will discharge home. Patient to complete 24 hour urine and follow up with clinic on Friday.

## 2013-02-17 NOTE — Progress Notes (Signed)
C/o's elevated BP this morning, with HA and some visual issues.  p-85

## 2013-02-17 NOTE — Progress Notes (Signed)
Unscheduled visit. She woke up with a headache and checked her BP. It was elevated and she came to the office. Her BP here was 160/99. U/S confirmed good FHR. I spoke with Dr. Debroah Loop a [facility] who will do her 4th C/S today. She will be a direct admit.

## 2013-02-17 NOTE — MAU Note (Signed)
Pt presents form [place] clinic and was evaluated by Dr Marice Potter this morning and sent to MAU for [facility] evaluation.

## 2013-02-18 ENCOUNTER — Other Ambulatory Visit: Payer: BC Managed Care – PPO

## 2013-02-18 NOTE — MAU Provider Note (Signed)
Agree with note, reviewed with the patient

## 2013-02-19 ENCOUNTER — Encounter (HOSPITAL_COMMUNITY): Payer: Self-pay | Admitting: Pharmacist

## 2013-02-19 LAB — CBC
HCT: 36.8 % (ref 36.0–46.0)
MCH: 28.6 pg (ref 26.0–34.0)
MCHC: 34 g/dL (ref 30.0–36.0)
MCV: 84.2 fL (ref 78.0–100.0)
Platelets: 214 10*3/uL (ref 150–400)
RDW: 15 % (ref 11.5–15.5)

## 2013-02-19 LAB — COMPREHENSIVE METABOLIC PANEL
ALT: 12 U/L (ref 0–35)
AST: 19 U/L (ref 0–37)
Alkaline Phosphatase: 91 U/L (ref 39–117)
BUN: 10 mg/dL (ref 6–23)
Calcium: 9.7 mg/dL (ref 8.4–10.5)
Creat: 0.61 mg/dL (ref 0.50–1.10)
Total Bilirubin: 0.4 mg/dL (ref 0.3–1.2)

## 2013-02-22 ENCOUNTER — Other Ambulatory Visit: Payer: Self-pay | Admitting: Family

## 2013-02-22 ENCOUNTER — Ambulatory Visit (INDEPENDENT_AMBULATORY_CARE_PROVIDER_SITE_OTHER): Payer: BC Managed Care – PPO | Admitting: Family

## 2013-02-22 VITALS — BP 151/98 | Wt 205.0 lb

## 2013-02-22 DIAGNOSIS — O133 Gestational [pregnancy-induced] hypertension without significant proteinuria, third trimester: Secondary | ICD-10-CM

## 2013-02-22 DIAGNOSIS — Z3480 Encounter for supervision of other normal pregnancy, unspecified trimester: Secondary | ICD-10-CM

## 2013-02-22 DIAGNOSIS — O10913 Unspecified pre-existing hypertension complicating pregnancy, third trimester: Secondary | ICD-10-CM

## 2013-02-22 DIAGNOSIS — O139 Gestational [pregnancy-induced] hypertension without significant proteinuria, unspecified trimester: Secondary | ICD-10-CM

## 2013-02-22 LAB — COMPREHENSIVE METABOLIC PANEL
ALT: 13 U/L (ref 0–35)
CO2: 20 mEq/L (ref 19–32)
Calcium: 9.5 mg/dL (ref 8.4–10.5)
Chloride: 103 mEq/L (ref 96–112)
Creat: 0.59 mg/dL (ref 0.50–1.10)
Sodium: 136 mEq/L (ref 135–145)
Total Protein: 6.3 g/dL (ref 6.0–8.3)

## 2013-02-22 LAB — PROTEIN / CREATININE RATIO, URINE: Protein Creatinine Ratio: 0.07 (ref <0.15)

## 2013-02-22 LAB — CBC
Platelets: 210 10*3/uL (ref 150–400)
RDW: 15.2 % (ref 11.5–15.5)
WBC: 10.1 10*3/uL (ref 4.0–10.5)

## 2013-02-22 NOTE — Progress Notes (Signed)
P-100 

## 2013-02-22 NOTE — Progress Notes (Signed)
Pt called with report of elevated blood pressure at home 156/101; continues with dull headache; Consulted with Dr. Penne Lash, due to patient desire to not go to hospital bring to office for stat labs and NS; NST reactive; CBC/CMP and urine protein/creatine sent to lab for STAT result.  BPP/AFI scheduled for Thursday.

## 2013-02-22 NOTE — Progress Notes (Signed)
Reviewed PreX labs with Dr. Jolayne Panther; obtain BPP/AFI with MFM tomorrow along with consultation regarding POC; keep Labetalol as prescribed to avoid masking worsening of blood pressure.  Message routed to [facility] for BPP/AFI order.  Pt called with plans.

## 2013-02-23 ENCOUNTER — Other Ambulatory Visit: Payer: Self-pay | Admitting: Obstetrics & Gynecology

## 2013-02-23 ENCOUNTER — Encounter (HOSPITAL_COMMUNITY): Payer: Self-pay

## 2013-02-23 ENCOUNTER — Encounter: Payer: BC Managed Care – PPO | Admitting: Obstetrics & Gynecology

## 2013-02-23 ENCOUNTER — Ambulatory Visit (HOSPITAL_COMMUNITY)
Admission: RE | Admit: 2013-02-23 | Discharge: 2013-02-23 | Disposition: A | Discharge status: Home / Self Care | Payer: BC Managed Care – PPO | Source: Ambulatory Visit | Attending: Obstetrics & Gynecology | Admitting: Obstetrics & Gynecology

## 2013-02-23 DIAGNOSIS — O09523 Supervision of elderly multigravida, third trimester: Secondary | ICD-10-CM

## 2013-02-23 DIAGNOSIS — O10913 Unspecified pre-existing hypertension complicating pregnancy, third trimester: Secondary | ICD-10-CM

## 2013-02-23 DIAGNOSIS — O2620 Pregnancy care for patient with recurrent pregnancy loss, unspecified trimester: Secondary | ICD-10-CM | POA: Insufficient documentation

## 2013-02-23 DIAGNOSIS — O3660X0 Maternal care for excessive fetal growth, unspecified trimester, not applicable or unspecified: Secondary | ICD-10-CM | POA: Insufficient documentation

## 2013-02-23 DIAGNOSIS — O99810 Abnormal glucose complicating pregnancy: Secondary | ICD-10-CM | POA: Insufficient documentation

## 2013-02-23 DIAGNOSIS — O10019 Pre-existing essential hypertension complicating pregnancy, unspecified trimester: Secondary | ICD-10-CM | POA: Insufficient documentation

## 2013-02-23 DIAGNOSIS — O09529 Supervision of elderly multigravida, unspecified trimester: Secondary | ICD-10-CM | POA: Insufficient documentation

## 2013-02-23 NOTE — Addendum Note (Signed)
Addended by: Granville Lewis on: 02/23/2013 08:12 AM   Modules accepted: Orders

## 2013-02-25 ENCOUNTER — Ambulatory Visit (INDEPENDENT_AMBULATORY_CARE_PROVIDER_SITE_OTHER): Payer: BC Managed Care – PPO | Admitting: *Deleted

## 2013-02-25 VITALS — BP 142/86

## 2013-02-25 DIAGNOSIS — O133 Gestational [pregnancy-induced] hypertension without significant proteinuria, third trimester: Secondary | ICD-10-CM

## 2013-02-25 DIAGNOSIS — O139 Gestational [pregnancy-induced] hypertension without significant proteinuria, unspecified trimester: Secondary | ICD-10-CM

## 2013-02-25 NOTE — Progress Notes (Signed)
P = 105    Pt inquired about her lab test results from 6/30 and [place] from 7/1. I reviewed all labs and [place] results and informed pt. She voiced understanding and is scheduled for Rpt C/s on 03/02/13.

## 2013-02-25 NOTE — Progress Notes (Signed)
NST performed today was reviewed and was found to be reactive.  Continue recommended antenatal testing and prenatal care.  

## 2013-03-01 ENCOUNTER — Encounter (HOSPITAL_COMMUNITY): Payer: Self-pay

## 2013-03-01 ENCOUNTER — Encounter (HOSPITAL_COMMUNITY)
Admission: RE | Admit: 2013-03-01 | Discharge: 2013-03-01 | Disposition: A | Discharge status: Home / Self Care | Payer: BC Managed Care – PPO | Source: Ambulatory Visit | Attending: Obstetrics & Gynecology | Admitting: Obstetrics & Gynecology

## 2013-03-01 VITALS — BP 147/92 | HR 88 | Resp 18 | Ht 60.0 in | Wt 209.0 lb

## 2013-03-01 DIAGNOSIS — O34219 Maternal care for unspecified type scar from previous cesarean delivery: Secondary | ICD-10-CM

## 2013-03-01 HISTORY — DX: Essential (primary) hypertension: I10

## 2013-03-01 LAB — CBC
MCH: 29.7 pg (ref 26.0–34.0)
Platelets: 176 10*3/uL (ref 150–400)
RBC: 4.01 MIL/uL (ref 3.87–5.11)
WBC: 8.7 10*3/uL (ref 4.0–10.5)

## 2013-03-01 LAB — TYPE AND SCREEN

## 2013-03-01 LAB — BASIC METABOLIC PANEL
CO2: 20 mEq/L (ref 19–32)
Calcium: 9.9 mg/dL (ref 8.4–10.5)
Creatinine, Ser: 0.63 mg/dL (ref 0.50–1.10)

## 2013-03-01 LAB — RPR: RPR Ser Ql: NONREACTIVE

## 2013-03-01 NOTE — Patient Instructions (Addendum)
   Your procedure is scheduled on: Tuesday, July 8  Enter through the [employer] [facility] at: 8am Pick up the phone at the desk and dial [phone] and inform us of your arrival.  Please call this number if you have any problems the morning of surgery: [phone]  Remember: Do not eat or drink after midnight: Monday Take these medicines the morning of surgery with a SIP OF WATER:  Labetalol.  Patient to withhold diabetes meds on Monday.  Do not wear jewelry, make-up, or FINGER nail polish No metal in your hair or on your body. Do not wear lotions, powders, perfumes. You may wear deodorant.  Please use your CHG wash as directed prior to surgery.  Do not shave anywhere for at least 12 hours prior to first CHG shower.  Do not bring valuables to the hospital. Contacts, dentures or bridgework may not be worn into surgery.  Leave suitcase in the car. After Surgery it may be brought to your room. For patients being admitted to the hospital, checkout time is 11:00am the day of discharge.  Home with husband Amalia Hailey.

## 2013-03-01 NOTE — Progress Notes (Signed)
Left message with [place] to alert Dr. Penne Lash there are no pre-op orders on this patient.

## 2013-03-02 ENCOUNTER — Encounter (HOSPITAL_COMMUNITY): Payer: Self-pay | Admitting: Anesthesiology

## 2013-03-02 ENCOUNTER — Inpatient Hospital Stay (HOSPITAL_COMMUNITY): Payer: BC Managed Care – PPO | Admitting: Anesthesiology

## 2013-03-02 ENCOUNTER — Inpatient Hospital Stay (HOSPITAL_COMMUNITY)
Admission: RE | Admit: 2013-03-02 | Discharge: 2013-03-04 | DRG: 371 | Disposition: A | Discharge status: Home / Self Care | Payer: BC Managed Care – PPO | Source: Ambulatory Visit | Attending: Obstetrics & Gynecology | Admitting: Obstetrics & Gynecology

## 2013-03-02 ENCOUNTER — Encounter (HOSPITAL_COMMUNITY)
Admission: RE | Disposition: A | Discharge status: Home / Self Care | Payer: Self-pay | Source: Ambulatory Visit | Attending: Obstetrics & Gynecology

## 2013-03-02 DIAGNOSIS — O09529 Supervision of elderly multigravida, unspecified trimester: Secondary | ICD-10-CM | POA: Diagnosis present

## 2013-03-02 DIAGNOSIS — O34219 Maternal care for unspecified type scar from previous cesarean delivery: Secondary | ICD-10-CM

## 2013-03-02 DIAGNOSIS — O139 Gestational [pregnancy-induced] hypertension without significant proteinuria, unspecified trimester: Secondary | ICD-10-CM

## 2013-03-02 DIAGNOSIS — O99814 Abnormal glucose complicating childbirth: Secondary | ICD-10-CM

## 2013-03-02 DIAGNOSIS — Z98891 History of uterine scar from previous surgery: Secondary | ICD-10-CM

## 2013-03-02 LAB — CBC
MCH: 29.1 pg (ref 26.0–34.0)
MCV: 86.2 fL (ref 78.0–100.0)
Platelets: 194 10*3/uL (ref 150–400)
RDW: 14.8 % (ref 11.5–15.5)
WBC: 8.7 10*3/uL (ref 4.0–10.5)

## 2013-03-02 SURGERY — Surgical Case
Anesthesia: Spinal | Site: Abdomen | Wound class: Clean Contaminated

## 2013-03-02 MED ORDER — NALBUPHINE SYRINGE 5 MG/0.5 ML
5.0000 mg | INJECTION | INTRAMUSCULAR | Status: DC | PRN
  Filled 2013-03-02: qty 1

## 2013-03-02 MED ORDER — LACTATED RINGERS IV SOLN
INTRAVENOUS | Status: DC | PRN
  Administered 2013-03-02 (×2): via INTRAVENOUS

## 2013-03-02 MED ORDER — CEFAZOLIN SODIUM-DEXTROSE 2-3 GM-% IV SOLR
INTRAVENOUS | Status: AC
  Filled 2013-03-02: qty 50

## 2013-03-02 MED ORDER — KETOROLAC TROMETHAMINE 30 MG/ML IJ SOLN: 30.0000 mg | Freq: Four times a day (QID) | INTRAMUSCULAR | Status: AC | PRN

## 2013-03-02 MED ORDER — LANOLIN HYDROUS EX OINT: 1.0000 "application " | TOPICAL_OINTMENT | CUTANEOUS | Status: DC | PRN

## 2013-03-02 MED ORDER — FENTANYL CITRATE 0.05 MG/ML IJ SOLN
INTRAMUSCULAR | Status: DC | PRN
  Administered 2013-03-02: 25 ug via INTRATHECAL

## 2013-03-02 MED ORDER — BUPIVACAINE HCL (PF) 0.75 % IJ SOLN
INTRAMUSCULAR | Status: DC | PRN
  Administered 2013-03-02: 1.5 mL

## 2013-03-02 MED ORDER — FENTANYL CITRATE 0.05 MG/ML IJ SOLN
INTRAMUSCULAR | Status: AC
  Filled 2013-03-02: qty 2

## 2013-03-02 MED ORDER — LACTATED RINGERS IV SOLN
INTRAVENOUS | Status: DC
  Administered 2013-03-02 (×5): via INTRAVENOUS

## 2013-03-02 MED ORDER — NALOXONE HCL 1 MG/ML IJ SOLN
1.0000 ug/kg/h | INTRAMUSCULAR | Status: DC | PRN
  Filled 2013-03-02: qty 2

## 2013-03-02 MED ORDER — DIPHENHYDRAMINE HCL 50 MG/ML IJ SOLN: 25.0000 mg | INTRAMUSCULAR | Status: DC | PRN

## 2013-03-02 MED ORDER — OXYCODONE-ACETAMINOPHEN 5-325 MG PO TABS
1.0000 | ORAL_TABLET | ORAL | Status: DC | PRN
  Administered 2013-03-03: 2 via ORAL
  Filled 2013-03-02: qty 2

## 2013-03-02 MED ORDER — ONDANSETRON HCL 4 MG/2ML IJ SOLN
INTRAMUSCULAR | Status: DC | PRN
  Administered 2013-03-02: 4 mg via INTRAVENOUS

## 2013-03-02 MED ORDER — MORPHINE SULFATE 0.5 MG/ML IJ SOLN
INTRAMUSCULAR | Status: AC
  Filled 2013-03-02: qty 10

## 2013-03-02 MED ORDER — NALOXONE HCL 0.4 MG/ML IJ SOLN: 0.4000 mg | INTRAMUSCULAR | Status: DC | PRN

## 2013-03-02 MED ORDER — ONDANSETRON HCL 4 MG/2ML IJ SOLN: 4.0000 mg | Freq: Three times a day (TID) | INTRAMUSCULAR | Status: DC | PRN

## 2013-03-02 MED ORDER — PHENYLEPHRINE 40 MCG/ML (10ML) SYRINGE FOR IV PUSH (FOR BLOOD PRESSURE SUPPORT)
PREFILLED_SYRINGE | INTRAVENOUS | Status: AC
  Filled 2013-03-02: qty 5

## 2013-03-02 MED ORDER — MORPHINE SULFATE (PF) 0.5 MG/ML IJ SOLN
INTRAMUSCULAR | Status: DC | PRN
  Administered 2013-03-02: .15 mg via INTRATHECAL

## 2013-03-02 MED ORDER — TETANUS-DIPHTH-ACELL PERTUSSIS 5-2.5-18.5 LF-MCG/0.5 IM SUSP
0.5000 mL | Freq: Once | INTRAMUSCULAR | Status: AC
  Administered 2013-03-03: 0.5 mL via INTRAMUSCULAR
  Filled 2013-03-02: qty 0.5

## 2013-03-02 MED ORDER — METOCLOPRAMIDE HCL 5 MG/ML IJ SOLN: 10.0000 mg | Freq: Three times a day (TID) | INTRAMUSCULAR | Status: DC | PRN

## 2013-03-02 MED ORDER — SCOPOLAMINE 1 MG/3DAYS TD PT72
MEDICATED_PATCH | TRANSDERMAL | Status: AC
  Administered 2013-03-02: 1.5 mg via TRANSDERMAL
  Filled 2013-03-02: qty 1

## 2013-03-02 MED ORDER — SIMETHICONE 80 MG PO CHEW: 80.0000 mg | CHEWABLE_TABLET | ORAL | Status: DC | PRN

## 2013-03-02 MED ORDER — DIPHENHYDRAMINE HCL 25 MG PO CAPS: 25.0000 mg | ORAL_CAPSULE | Freq: Four times a day (QID) | ORAL | Status: DC | PRN

## 2013-03-02 MED ORDER — PHENYLEPHRINE HCL 10 MG/ML IJ SOLN
INTRAMUSCULAR | Status: DC | PRN
  Administered 2013-03-02 (×3): 40 ug via INTRAVENOUS
  Administered 2013-03-02: 80 ug via INTRAVENOUS
  Administered 2013-03-02 (×3): 40 ug via INTRAVENOUS
  Administered 2013-03-02: 80 ug via INTRAVENOUS
  Administered 2013-03-02 (×8): 40 ug via INTRAVENOUS

## 2013-03-02 MED ORDER — KETOROLAC TROMETHAMINE 60 MG/2ML IM SOLN
60.0000 mg | Freq: Once | INTRAMUSCULAR | Status: AC | PRN
  Administered 2013-03-02: 60 mg via INTRAMUSCULAR

## 2013-03-02 MED ORDER — DIBUCAINE 1 % RE OINT: 1.0000 "application " | TOPICAL_OINTMENT | RECTAL | Status: DC | PRN

## 2013-03-02 MED ORDER — KETOROLAC TROMETHAMINE 60 MG/2ML IM SOLN
INTRAMUSCULAR | Status: AC
  Filled 2013-03-02: qty 2

## 2013-03-02 MED ORDER — OXYTOCIN 40 UNITS IN LACTATED RINGERS INFUSION - SIMPLE MED: 62.5000 mL/h | INTRAVENOUS | Status: AC

## 2013-03-02 MED ORDER — WITCH HAZEL-GLYCERIN EX PADS: 1.0000 "application " | MEDICATED_PAD | CUTANEOUS | Status: DC | PRN

## 2013-03-02 MED ORDER — MENTHOL 3 MG MT LOZG: 1.0000 | LOZENGE | OROMUCOSAL | Status: DC | PRN

## 2013-03-02 MED ORDER — SCOPOLAMINE 1 MG/3DAYS TD PT72: 1.0000 | MEDICATED_PATCH | TRANSDERMAL | Status: DC

## 2013-03-02 MED ORDER — ONDANSETRON HCL 4 MG/2ML IJ SOLN: 4.0000 mg | INTRAMUSCULAR | Status: DC | PRN

## 2013-03-02 MED ORDER — FENTANYL CITRATE 0.05 MG/ML IJ SOLN: 25.0000 ug | INTRAMUSCULAR | Status: DC | PRN

## 2013-03-02 MED ORDER — IBUPROFEN 600 MG PO TABS
600.0000 mg | ORAL_TABLET | Freq: Four times a day (QID) | ORAL | Status: DC
  Administered 2013-03-02 – 2013-03-04 (×7): 600 mg via ORAL
  Filled 2013-03-02 (×7): qty 1

## 2013-03-02 MED ORDER — CEFAZOLIN SODIUM-DEXTROSE 2-3 GM-% IV SOLR
INTRAVENOUS | Status: DC | PRN
  Administered 2013-03-02: 2 g via INTRAVENOUS

## 2013-03-02 MED ORDER — CEFAZOLIN SODIUM-DEXTROSE 2-3 GM-% IV SOLR: 2.0000 g | INTRAVENOUS | Status: DC

## 2013-03-02 MED ORDER — DIPHENHYDRAMINE HCL 50 MG/ML IJ SOLN
12.5000 mg | INTRAMUSCULAR | Status: DC | PRN
  Administered 2013-03-02: 12.5 mg via INTRAVENOUS
  Filled 2013-03-02 (×2): qty 1

## 2013-03-02 MED ORDER — SCOPOLAMINE 1 MG/3DAYS TD PT72: 1.0000 | MEDICATED_PATCH | Freq: Once | TRANSDERMAL | Status: DC

## 2013-03-02 MED ORDER — LACTATED RINGERS IV SOLN: INTRAVENOUS | Status: DC

## 2013-03-02 MED ORDER — PRENATAL MULTIVITAMIN CH
1.0000 | ORAL_TABLET | Freq: Every day | ORAL | Status: DC
  Administered 2013-03-03: 1 via ORAL
  Filled 2013-03-02: qty 1

## 2013-03-02 MED ORDER — ATROPINE SULFATE 0.4 MG/ML IJ SOLN
INTRAMUSCULAR | Status: AC
  Filled 2013-03-02: qty 1

## 2013-03-02 MED ORDER — OXYTOCIN 10 UNIT/ML IJ SOLN
INTRAMUSCULAR | Status: DC | PRN
  Administered 2013-03-02: 40 [IU] via INTRAMUSCULAR

## 2013-03-02 MED ORDER — MEPERIDINE HCL 25 MG/ML IJ SOLN: 6.2500 mg | INTRAMUSCULAR | Status: DC | PRN

## 2013-03-02 MED ORDER — MEASLES, MUMPS & RUBELLA VAC ~~LOC~~ INJ
0.5000 mL | INJECTION | Freq: Once | SUBCUTANEOUS | Status: DC
  Filled 2013-03-02: qty 0.5

## 2013-03-02 MED ORDER — SODIUM CHLORIDE 0.9 % IJ SOLN: 3.0000 mL | INTRAMUSCULAR | Status: DC | PRN

## 2013-03-02 MED ORDER — DIPHENHYDRAMINE HCL 25 MG PO CAPS
25.0000 mg | ORAL_CAPSULE | ORAL | Status: DC | PRN
  Filled 2013-03-02: qty 1

## 2013-03-02 MED ORDER — ATROPINE SULFATE 0.4 MG/ML IJ SOLN
INTRAMUSCULAR | Status: DC | PRN
  Administered 2013-03-02: 0.2 mg via INTRAVENOUS

## 2013-03-02 MED ORDER — 0.9 % SODIUM CHLORIDE (POUR BTL) OPTIME
TOPICAL | Status: DC | PRN
  Administered 2013-03-02: 1000 mL

## 2013-03-02 MED ORDER — ONDANSETRON HCL 4 MG PO TABS: 4.0000 mg | ORAL_TABLET | ORAL | Status: DC | PRN

## 2013-03-02 MED ORDER — LACTATED RINGERS IV SOLN
INTRAVENOUS | Status: DC
  Administered 2013-03-02: 20:00:00 via INTRAVENOUS

## 2013-03-02 MED ORDER — EPHEDRINE SULFATE 50 MG/ML IJ SOLN
INTRAMUSCULAR | Status: DC | PRN
  Administered 2013-03-02: 10 mg via INTRAVENOUS
  Administered 2013-03-02 (×3): 5 mg via INTRAVENOUS
  Administered 2013-03-02: 10 mg via INTRAVENOUS
  Administered 2013-03-02: 5 mg via INTRAVENOUS
  Administered 2013-03-02: 10 mg via INTRAVENOUS

## 2013-03-02 SURGICAL SUPPLY — 33 items
CLAMP CORD UMBIL (MISCELLANEOUS) IMPLANT
CLIP FILSHIE TUBAL LIGA STRL (Clip) ×2 IMPLANT
CLOTH BEACON ORANGE TIMEOUT ST (SAFETY) ×2 IMPLANT
CONTAINER PREFILL 10% NBF 15ML (MISCELLANEOUS) IMPLANT
DRAIN JACKSON PRT FLT 7MM (DRAIN) IMPLANT
DRAPE LG THREE QUARTER DISP (DRAPES) ×2 IMPLANT
DRSG OPSITE POSTOP 4X10 (GAUZE/BANDAGES/DRESSINGS) ×2 IMPLANT
DURAPREP 26ML APPLICATOR (WOUND CARE) ×2 IMPLANT
ELECT REM PT RETURN 9FT ADLT (ELECTROSURGICAL) ×2
ELECTRODE REM PT RTRN 9FT ADLT (ELECTROSURGICAL) ×1 IMPLANT
EVACUATOR SILICONE 100CC (DRAIN) IMPLANT
EXTRACTOR VACUUM M CUP 4 TUBE (SUCTIONS) IMPLANT
GLOVE BIO SURGEON STRL SZ7 (GLOVE) ×2 IMPLANT
GLOVE BIOGEL PI IND STRL 7.0 (GLOVE) ×1 IMPLANT
GLOVE BIOGEL PI INDICATOR 7.0 (GLOVE) ×1
GOWN STRL REIN XL XLG (GOWN DISPOSABLE) ×4 IMPLANT
KIT ABG SYR 3ML LUER SLIP (SYRINGE) IMPLANT
NDL HYPO 25X5/8 SAFETYGLIDE (NEEDLE) ×1 IMPLANT
NEEDLE HYPO 25X5/8 SAFETYGLIDE (NEEDLE) ×2 IMPLANT
NS IRRIG 1000ML POUR BTL (IV SOLUTION) ×2 IMPLANT
PACK C SECTION WH (CUSTOM PROCEDURE TRAY) ×2 IMPLANT
PAD ABD 7.5X8 STRL (GAUZE/BANDAGES/DRESSINGS) ×1 IMPLANT
PAD OB MATERNITY 4.3X12.25 (PERSONAL CARE ITEMS) ×2 IMPLANT
RTRCTR C-SECT PINK 25CM LRG (MISCELLANEOUS) ×2 IMPLANT
STAPLER VISISTAT 35W (STAPLE) ×1 IMPLANT
SUT PLAIN 2 0 XLH (SUTURE) ×1 IMPLANT
SUT VIC AB 0 CTX 36 (SUTURE) ×8
SUT VIC AB 0 CTX36XBRD ANBCTRL (SUTURE) ×5 IMPLANT
SUT VIC AB 4-0 KS 27 (SUTURE) ×1 IMPLANT
TAPE CLOTH SURG 4X10 WHT LF (GAUZE/BANDAGES/DRESSINGS) ×1 IMPLANT
TOWEL OR 17X24 6PK STRL BLUE (TOWEL DISPOSABLE) ×6 IMPLANT
TRAY FOLEY CATH 14FR (SET/KITS/TRAYS/PACK) ×2 IMPLANT
WATER STERILE IRR 1000ML POUR (IV SOLUTION) ×2 IMPLANT

## 2013-03-02 NOTE — H&P (Addendum)
[name] is a  42 y.o. female presenting for repeat cesarean section and bilateral tubal ligation. History OB History   Grav Para Term Preterm Abortions TAB SAB Ect Mult Living   9 3 3  5  5   3      Past Medical History  Diagnosis Date  . Infertility associated with anovulation   . Gestational diabetes     oral meds   . Hypertension      PIH   Past Surgical History  Procedure Laterality Date  . Cholecystectomy    . Tonsillectomy and adenoidectomy    . Inner ear surgery    . Dilation and curettage of uterus      x3 for MAB's   Family History: family history includes Asthma in her mother; COPD in her mother; Cancer - Other in her paternal grandmother; Congestive Heart Failure in her mother; and Hypertension in her father. Social History:  reports that she has never smoked. She has never used smokeless tobacco. She reports that she does not drink alcohol or use illicit drugs.   Prenatal Transfer Tool  Maternal Diabetes: Yes:  Diabetes Type:  Insulin/Medication controlled Genetic Screening: Declined Maternal Ultrasounds/Referrals: Normal Fetal Ultrasounds or other Referrals:  Fetal echo Maternal Substance Abuse:  No Significant Maternal Medications:  Meds include: Other: labetalol, metformin, glyburide Significant Maternal Lab Results:  None Other Comments:  Gestational hypertension  ROSNo headache, scotomata RUQ pain.   Blood pressure 145/98, pulse 90, temperature 98.2 F (36.8 C), temperature source Oral, last menstrual period 06/02/2012, SpO2 98.00%. Exam Physical Exam  Vitals reviewed. Constitutional: She is oriented to person, place, and time. She appears well-developed and well-nourished. No distress.  HENT:  Head: Normocephalic and atraumatic.  Eyes: Conjunctivae are normal.  Neck: Neck supple. No thyromegaly present.  Cardiovascular: Normal rate and regular rhythm.   Respiratory: Breath sounds normal. No respiratory distress. She has no wheezes. She has no  rales.  GI: Soft. There is no tenderness. There is no rebound and no guarding.  Musculoskeletal: She exhibits no tenderness.  Neurological: She is alert and oriented to person, place, and time.  Skin: Skin is warm and dry.  Psychiatric: She has a normal mood and affect.    Prenatal labs: ABO, Rh: --/--/B POS (07/07 1005) Antibody: NEG (07/07 1005) Rubella: 1.26 (12/16 1108) RPR: NON REACTIVE (07/07 1005)  HBsAg: NEGATIVE (12/16 1108)  HIV: NON REACTIVE (04/16 0923)  GBS:  negative  Assessment/Plan: 42 [id number] At 39 weeks for rpt c/s and BTL  1-BP high, will order PIH labs 2-Good glycemic control  3-Pt has active type and screen  The risks of cesarean section discussed with the patient included but were not limited to: bleeding which may require transfusion or reoperation; infection which may require antibiotics; injury to bowel, bladder, ureters or other surrounding organs; injury to the fetus; need for additional procedures including hysterectomy in the event of a life-threatening hemorrhage; placental abnormalities wth subsequent pregnancies, incisional problems, thromboembolic phenomenon and other postoperative/anesthesia complications. The patient concurred with the proposed plan, giving informed written consent for the procedure.   Patient desires permanent sterilization.  Other reversible forms of contraception were discussed with patient; she declines all other modalities. Risks of procedure discussed with patient including but not limited to: risk of regret, permanence of method, bleeding, infection, injury to surrounding organs and need for additional procedures.  Failure risk of 0.5-1% with increased risk of ectopic gestation if pregnancy occurs was also discussed with patient.  Patient verbalized understanding of these risks and wants to proceed with sterilization.  Written informed consent obtained.  To OR when ready.     LEGGETT,KELLY H. 03/02/2013, 9:06 AM

## 2013-03-02 NOTE — Anesthesia Procedure Notes (Signed)
Spinal  Patient location during procedure: OR Start time: 03/02/2013 9:51 AM Staffing Anesthesiologist: Angus Seller., Harrell Gave. Performed by: anesthesiologist  Preanesthetic Checklist Completed: patient identified, site marked, surgical consent, pre-op evaluation, timeout performed, IV checked, risks and benefits discussed and monitors and equipment checked Spinal Block Patient position: sitting Prep: DuraPrep Patient monitoring: heart rate, cardiac monitor, continuous pulse ox and blood pressure Approach: midline Location: L3-4 Injection technique: single-shot Needle Needle type: Sprotte  Needle gauge: 24 G Needle length: 9 cm Assessment Sensory level: T4 Additional Notes Patient identified.  Risk benefits discussed including failed block, incomplete pain control, headache, nerve damage, paralysis, blood pressure changes, nausea, vomiting, reactions to medication both toxic or allergic, and postpartum back pain.  Patient expressed understanding and wished to proceed.  All questions were answered.  Sterile technique used throughout procedure.  CSF was clear.  No parasthesia or other complications.  Please see nursing notes for vital signs.

## 2013-03-02 NOTE — Anesthesia Preprocedure Evaluation (Addendum)
Anesthesia Evaluation  Patient identified by MRN, date of birth, ID band Patient awake    Reviewed: Allergy & Precautions, H&P , NPO status , Patient's Chart, lab work & pertinent test results, reviewed documented beta blocker date and time   History of Anesthesia Complications Negative for: history of anesthetic complications  Airway Mallampati: II TM Distance: <3 FB Neck ROM: full    Dental  (+) Teeth Intact   Pulmonary neg pulmonary ROS,  breath sounds clear to auscultation  Pulmonary exam normal       Cardiovascular hypertension (CHTN on labetalol), On Home Beta Blockers Rhythm:regular Rate:Normal     Neuro/Psych negative neurological ROS  negative psych ROS   GI/Hepatic negative GI ROS, Neg liver ROS,   Endo/Other  diabetes, Gestational, Oral Hypoglycemic AgentsMorbid obesity  Renal/GU negative Renal ROS  negative genitourinary   Musculoskeletal   Abdominal Normal abdominal exam  (+)   Peds  Hematology negative hematology ROS (+)   Anesthesia Other Findings   Reproductive/Obstetrics (+) Pregnancy (h/o prior c/s x3, for 4th c/s)                         Anesthesia Physical Anesthesia Plan  ASA: III  Anesthesia Plan: Spinal   Post-op Pain Management:    Induction:   Airway Management Planned:   Additional Equipment:   Intra-op Plan:   Post-operative Plan:   Informed Consent: I have reviewed the patients History and Physical, chart, labs and discussed the procedure including the risks, benefits and alternatives for the proposed anesthesia with the patient or authorized representative who has indicated his/her understanding and acceptance.     Plan Discussed with: Surgeon and CRNA  Anesthesia Plan Comments:        Anesthesia Quick Evaluation

## 2013-03-02 NOTE — Preoperative (Signed)
Beta Blockers   Reason not to administer Beta Blockers:Hold beta blocker due to other;Patient took medication at home this a.m. as prescribed

## 2013-03-02 NOTE — Anesthesia Postprocedure Evaluation (Signed)
  Anesthesia Post-op Note  Patient: [name]  Procedure(s) Performed: Procedure(s) with comments: CESAREAN SECTION WITH BILATERAL TUBAL LIGATION (N/A) - Filshie clips  Patient Location: PACU and Mother/Baby  Anesthesia Type:Spinal  Level of Consciousness: awake, alert , oriented and patient cooperative  Airway and Oxygen Therapy: Patient Spontanous Breathing  Post-op Pain: none  Post-op Assessment: Post-op Vital signs reviewed, Patient's Cardiovascular Status Stable and Respiratory Function Stable  Post-op Vital Signs: Reviewed and stable  Complications: No apparent anesthesia complications

## 2013-03-02 NOTE — Transfer of Care (Signed)
Immediate Anesthesia Transfer of Care Note  Patient: [name]  Procedure(s) Performed: Procedure(s) with comments: CESAREAN SECTION WITH BILATERAL TUBAL LIGATION (N/A) - Filshie clips  Patient Location: PACU  Anesthesia Type:Spinal  Level of Consciousness: awake, alert , oriented and patient cooperative  Airway & Oxygen Therapy: Patient Spontanous Breathing  Post-op Assessment: Report given to PACU RN and Post -op Vital signs reviewed and stable  Post vital signs: Reviewed and stable  Complications: No apparent anesthesia complications

## 2013-03-02 NOTE — Lactation Note (Signed)
This note was copied from the chart of [name]. Lactation Consultation Note  Patient Name: [name] [id number] Date: 03/02/2013 Reason for consult: Initial assessment Assisted mother in PACU. Mother is a gestational diabetic.Baby had a blood sugar of 28. Baby is skin to skin and alert. Baby was guided to the breast and he suck on and off initially. He later fed in a rhythmic pattern for 15 minutes on the left breast. Mother was shown hand expression but will need additional reinforcement of teaching. Mother was able to express colostrum especially from the left breast. On admission, mother states she wants to breast feed but has had to give formula to her last baby due to low blood sugar issues. She states she is not opposed to baby getting formula if needed. Instructed we will continue to help her with breastfeeding and encouraged skin to skin. Lactation handout given with information about services. LC to follow due to gestational diabetes and patient having history of breastfeeding challenges resulting in pumping mostly. She would like to be able to feed baby at the breast this time. Instructed to ask the nurse to assist with feeding as needed. Maternal Data Formula Feeding for Exclusion: No Does the patient have breastfeeding experience prior to this delivery?: Yes  Feeding Feeding Type: Breast Milk Feeding method: Breast  LATCH Score/Interventions Latch: Repeated attempts needed to sustain latch, nipple held in mouth throughout feeding, stimulation needed to elicit sucking reflex. Intervention(s): Assist with latch;Breast compression  Audible Swallowing: A few with stimulation  Type of Nipple: Everted at rest and after stimulation  Comfort (Breast/Nipple): Soft / non-tender     Hold (Positioning): Assistance needed to correctly position infant at breast and maintain latch.  LATCH Score: 7  Lactation Tools Discussed/Used     Consult Status Consult Status:  Follow-up Date: 03/03/13 Follow-up type: In-patient    Christella Hartigan M 03/02/2013, 12:17 PM

## 2013-03-02 NOTE — Anesthesia Postprocedure Evaluation (Signed)
Anesthesia Post Note  Patient: [name]  Procedure(s) Performed: Procedure(s) (LRB): CESAREAN SECTION WITH BILATERAL TUBAL LIGATION (N/A)  Anesthesia type: Spinal  Patient location: PACU  Post pain: Pain level controlled  Post assessment: Post-op Vital signs reviewed  Last Vitals:  Filed Vitals:   03/02/13 1115  BP: 132/69  Pulse: 68  Temp:   Resp: 18    Post vital signs: Reviewed  Level of consciousness: awake  Complications: No apparent anesthesia complications

## 2013-03-02 NOTE — Op Note (Signed)
[name] PROCEDURE DATE: 03/02/2013  PREOPERATIVE DIAGNOSIS: Intrauterine pregnancy at  [redacted]w[redacted]d weeks gestation for repeat cesarean section and bilateral tubal ligation  POSTOPERATIVE DIAGNOSIS: The same  PROCEDURE:    Low Transverse Cesarean Section and BTL  SURGEON:  Dr. Elsie Lincoln  INDICATIONS: [name] is a 42 y.o. [id number] at [redacted]w[redacted]d scheduled for cesarean section secondary 3 prior c/s.  The risks of cesarean section discussed with the patient included but were not limited to: bleeding which may require transfusion or reoperation; infection which may require antibiotics; injury to bowel, bladder, ureters or other surrounding organs; injury to the fetus; need for additional procedures including hysterectomy in the event of a life-threatening hemorrhage; placental abnormalities wth subsequent pregnancies, incisional problems, thromboembolic phenomenon and other postoperative/anesthesia complications. The patient concurred with the proposed plan, giving informed written consent for the procedure.    FINDINGS:  Viable female infant in cephalic presentation, clear amniotic fluid.  Intact placenta, three vessel cord.  Grossly normal uterus, ovaries and fallopian tubes. .   ANESTHESIA:    Epidural  ESTIMATED BLOOD LOSS: 500 ml  SPECIMENS: Placenta sent to donation  COMPLICATIONS: None immediate  PROCEDURE IN DETAIL:  The patient received intravenous antibiotics and had sequential compression devices applied to her lower extremities while in the preoperative area.  She was then taken to the operating room where spinal anesthesia was administered and was found to be adequate. She was then placed in a dorsal supine position with a leftward tilt, and prepped and draped in a sterile manner.  A foley catheter was placed into her bladder and attached to constant gravity.  After an adequate timeout was performed, a Pfannenstiel skin incision was made with scalpel and carried through to the  underlying layer of fascia. The fascia was incised in the midline and this incision was extended bilaterally using the Mayo scissors. Kocher clamps were applied to the superior aspect of the fascial incision and the underlying rectus muscles were dissected off bluntly. A similar process was carried out on the inferior aspect of the facial incision. The rectus muscles were separated in the midline bluntly and the peritoneum was entered bluntly.   A transverse hysterotomy was made with a scalpel and extended bilaterally bluntly. The bladder blade was then removed. The infant was successfully delivered, and cord was clamped and cut and infant was handed over to awaiting neonatology team. Uterine massage was then administered and the placenta delivered intact with three-vessel cord. The uterus was cleared of clot and debris.  The hysterotomy was closed with 0 vicryl.  A second imbricating suture of 0-Vicryl was used to reinforce the incision and aid in hemostasis.  The peritoneum and rectus muscles were noted to be hemostatic.  The fascia was closed with 0-Vicryl in a running fashion with good restoration of anatomy.  The subcutaneus tissue was copiously irrigated.  The skin was closed with staples.  Pt tolerated the procedure will.  All counts were correct x2.  Pt went to the recovery room in stable condition.

## 2013-03-03 ENCOUNTER — Encounter (HOSPITAL_COMMUNITY): Payer: Self-pay | Admitting: Obstetrics & Gynecology

## 2013-03-03 LAB — BIRTH TISSUE RECOVERY COLLECTION (PLACENTA DONATION)

## 2013-03-03 LAB — CBC
MCH: 29.1 pg (ref 26.0–34.0)
MCHC: 33.7 g/dL (ref 30.0–36.0)
MCV: 86.5 fL (ref 78.0–100.0)
Platelets: 147 10*3/uL — ABNORMAL LOW (ref 150–400)
RDW: 14.7 % (ref 11.5–15.5)

## 2013-03-03 NOTE — Progress Notes (Signed)
Post Partum Day 1: Repeat low transverse c-section and BTL Subjective: no complaints, up ad lib, voiding, tolerating PO and breastfeeding is going okay. Has not had flatus or BM yet.  Objective: Blood pressure 111/76, pulse 82, temperature 97.8 F (36.6 C), temperature source Oral, resp. rate 16, weight 94.802 kg (209 lb), last menstrual period 06/02/2012, SpO2 98.00%, unknown if currently breastfeeding.  Physical Exam:  General: alert, cooperative, appears stated age and no distress Lochia: appropriate Uterine Fundus: firm Incision: healing well, no significant drainage, no dehiscence, dressing in place and dry DVT Evaluation: No evidence of DVT seen on physical exam. No cords or calf tenderness. No significant calf/ankle edema. Abdomen: NABS   Recent Labs  03/02/13 0818 03/03/13 0600  HGB 12.2 10.1*  HCT 36.1 30.0*    Assessment/Plan: 42 y.o. [id number] with GDMA2 and gestational HTN POD#1 after repeat LTCS and BTL at 39 weeks - Breastfeeding, Lactation consult, Circumcision prior to discharge and Contraception s/p BTL - BP - Elevated to 140-150s/60-80s on admission, now 110s/60s s/p delivery, PLTs slightly low this morning, other PIH labs were cancelled on admission. Continue to monitor BP - GDMA2 - BG WNL in 90s   LOS: 1 day   Simone Curia 03/03/2013, 7:36 AM   I have seen and examined this patient and agree the above assessment. CRESENZO-DISHMAN,Clotee Schlicker 03/03/2013 8:06 AM

## 2013-03-04 ENCOUNTER — Encounter (HOSPITAL_COMMUNITY): Payer: Self-pay

## 2013-03-04 ENCOUNTER — Telehealth: Payer: Self-pay | Admitting: *Deleted

## 2013-03-04 MED ORDER — IBUPROFEN 600 MG PO TABS: 600.0000 mg | ORAL_TABLET | Freq: Four times a day (QID) | ORAL | Status: DC | PRN

## 2013-03-04 MED ORDER — IBUPROFEN 600 MG PO TABS: 600.0000 mg | ORAL_TABLET | Freq: Four times a day (QID) | ORAL | Status: DC

## 2013-03-04 NOTE — Telephone Encounter (Signed)
Spoke with pt's husband in hospital with [name] after her C/Section.  He states she is doing good and baby is good.  Other than baby wanting to nurse all the time and staff coming in frequently they are running on no sleep.  I just wanted to touch base with the couple as a courtesy call.

## 2013-03-04 NOTE — Discharge Summary (Signed)
Obstetric Discharge Summary Reason for Admission: cesarean section, repeat, and BTL Prenatal Procedures: fetal echo Intrapartum Procedures: cesarean: low cervical, transverse and tubal ligation Postpartum Procedures: none Complications-Operative and Postpartum: none Hemoglobin  Date Value Range Status  03/03/2013 10.1* 12.0 - 15.0 g/dL Final     REPEATED TO VERIFY     DELTA CHECK NOTED     HCT  Date Value Range Status  03/03/2013 30.0* 36.0 - 46.0 % Final    Physical Exam:  General: alert, cooperative, appears stated age and no distress Lochia: appropriate Uterine Fundus: firm Incision: healing well, no significant drainage, no dehiscence DVT Evaluation: No evidence of DVT seen on physical exam. No cords or calf tenderness. No significant calf/ankle edema. RRR, II/VI systolic murmur CTAB with no wheezes or crackles  Discharge Diagnoses: 42 y.o. [id number] with GDMA2 and gestational HTN POD#2 after repeat LTCS and BTL at 39 weeks   Discharge Information: Date: 03/04/2013 - Breastfeeding, Lactation consult, infant received circumcision and contraception s/p BTL  - Gestational HTN - Elevated to 140-150s/60-80s on admission, now 140s systolic, PLTs slightly low yesterday, other PIH labs were cancelled on admission. No symptoms of dizziness, headache, vision changes, or RUQ pain and per pt, high bp with all pregnancies but not otherwise; Stable for d/c. - GDMA2 - BG WNL Activity: pelvic rest Diet: routine Medications: Ibuprofen and Colace Condition: stable Instructions: refer to practice specific booklet Discharge to: home  Newborn Data: Live born female  Birth Weight: 8 lb 3.4 oz (3725 g) APGAR: 8, 9  Home with mother.  [name] 03/04/2013, 6:52 AM  Seen also by me Agree with note Wynelle Bourgeois CNM

## 2013-03-08 ENCOUNTER — Encounter: Payer: Self-pay | Admitting: Advanced Practice Midwife

## 2013-03-08 ENCOUNTER — Ambulatory Visit (INDEPENDENT_AMBULATORY_CARE_PROVIDER_SITE_OTHER): Payer: BC Managed Care – PPO | Admitting: Advanced Practice Midwife

## 2013-03-08 VITALS — BP 151/94 | HR 79 | Resp 16 | Wt 191.0 lb

## 2013-03-08 DIAGNOSIS — Z3480 Encounter for supervision of other normal pregnancy, unspecified trimester: Secondary | ICD-10-CM

## 2013-03-08 DIAGNOSIS — O10912 Unspecified pre-existing hypertension complicating pregnancy, second trimester: Secondary | ICD-10-CM

## 2013-03-08 DIAGNOSIS — O10019 Pre-existing essential hypertension complicating pregnancy, unspecified trimester: Secondary | ICD-10-CM

## 2013-03-08 NOTE — Progress Notes (Signed)
Patient ID: [name], female   DOB: 1971-01-01, 42 y.o.   MRN: [id number]  S: Pt in office 1 week post RLTCS for staple removal.  She denies pain, n/v, headache, epigastric pain, visual disturbances, fever/chills.  Baby is breastfeeding and doing well.    O: BP 151/94  Pulse 79  Resp 16  Wt 191 lb (86.637 kg)  BMI 37.3 kg/m2  LMP 06/02/2012  Incision healing well , well approximated with no significant erythema, edema, or drainage.  Staples removed without difficulty by RN.  Pt tolerated well.  A: 1. Hypertension in pregnancy, pre-existing   Staple removal  P: Called Dr Debroah Loop to discussed elevated BP in postpartum pt Pt to resume Labetalol 100 mg BID (same as pregnancy dose) at this time Plan to reevaluate need for meds at 6 week visit   Sharen Counter Certified Nurse-Midwife

## 2013-04-05 ENCOUNTER — Ambulatory Visit: Payer: BC Managed Care – PPO | Admitting: Advanced Practice Midwife

## 2013-04-06 ENCOUNTER — Encounter: Payer: Self-pay | Admitting: *Deleted

## 2013-07-01 ENCOUNTER — Other Ambulatory Visit: Payer: Self-pay

## 2014-06-10 ENCOUNTER — Other Ambulatory Visit: Payer: Self-pay

## 2014-06-27 ENCOUNTER — Encounter: Payer: Self-pay | Admitting: Advanced Practice Midwife

## 2014-12-23 IMAGING — US US OB FOLLOW-UP
1 series · 12 of 28 positions shown · non-contrast
Comparison: none

[Series 1: us ob follow up · 12 of 47 slices shown]
[im 2/47]
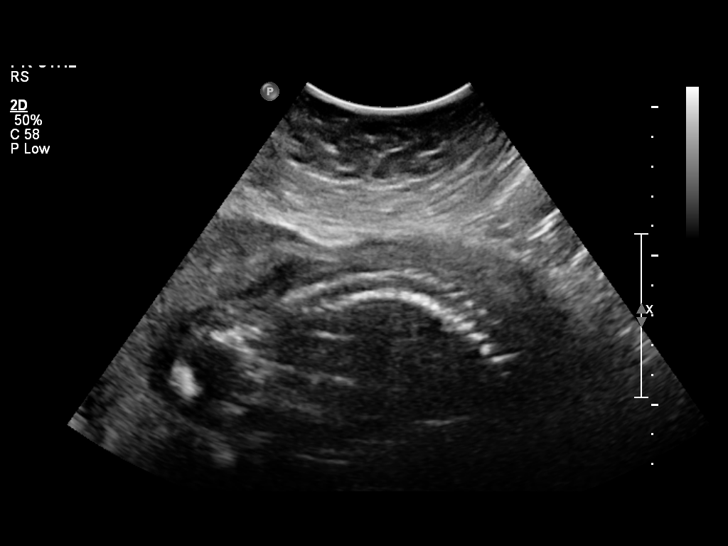
[im 6/47]
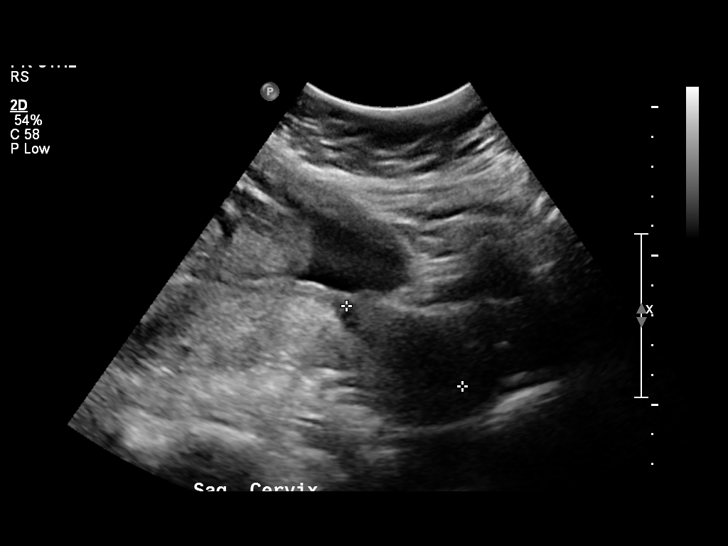
[im 9/47]
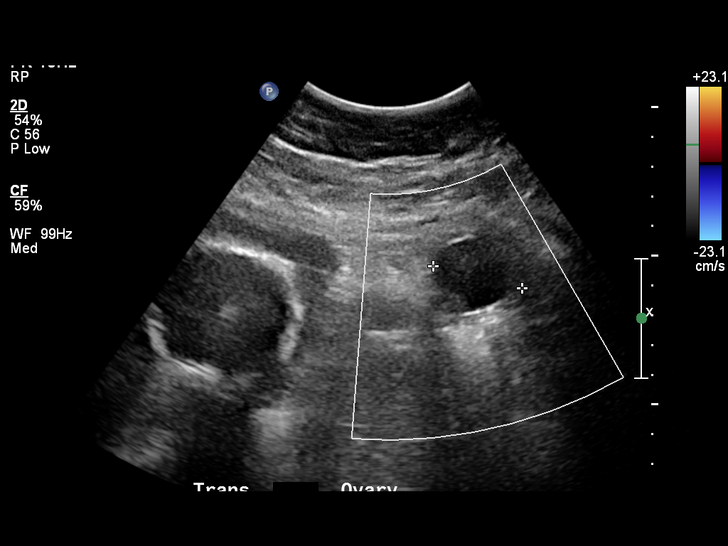
[im 14/47]
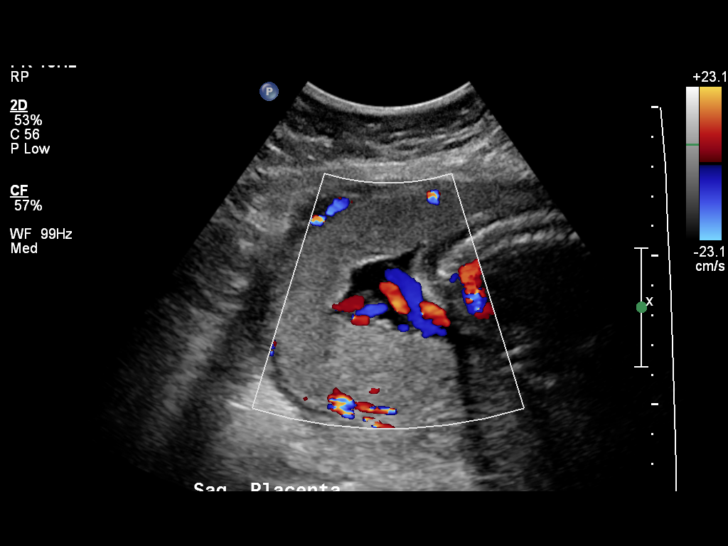
[im 18/47]
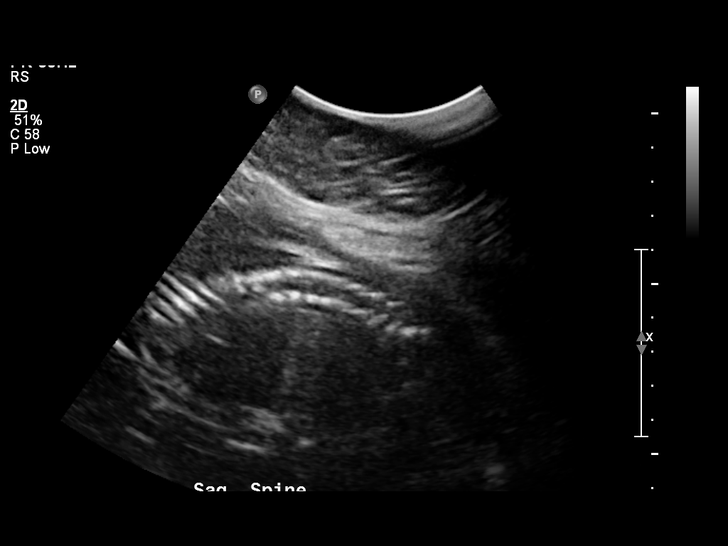
[im 21/47]
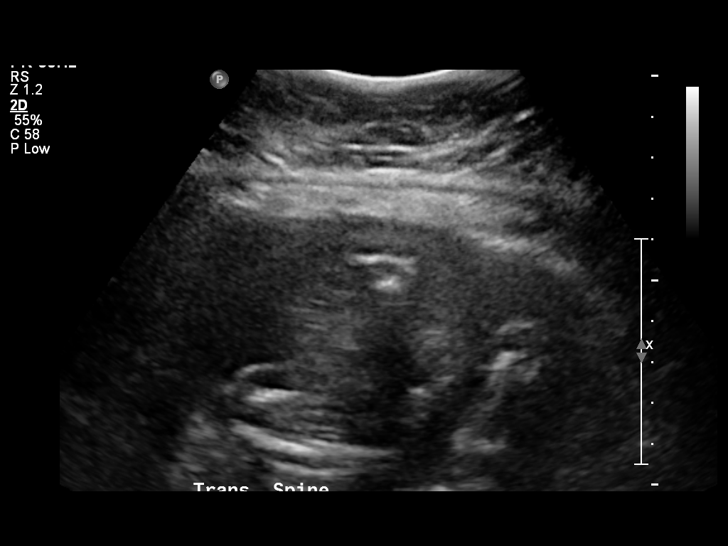
[im 26/47]
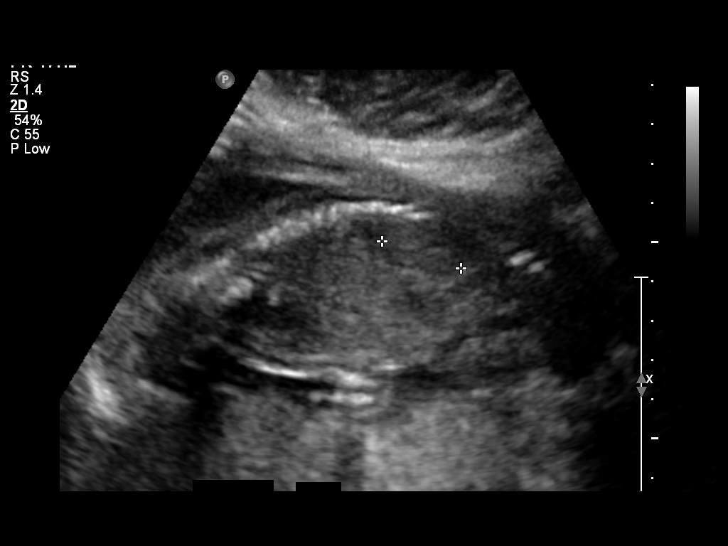
[im 29/47]
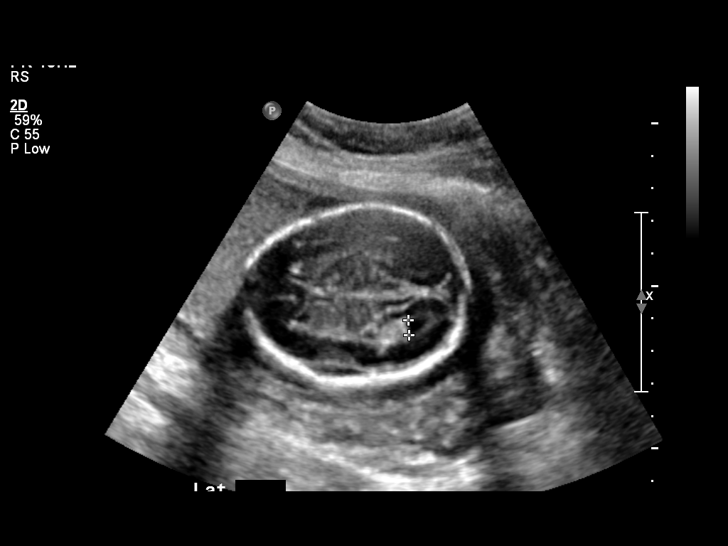
[im 33/47]
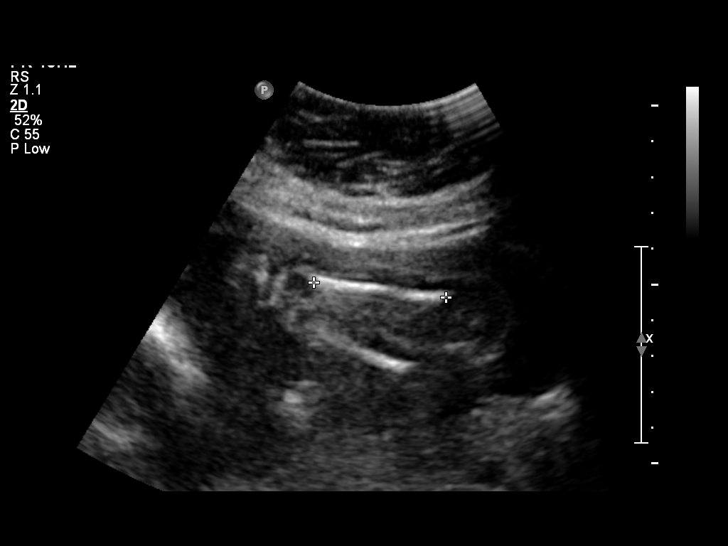
[im 38/47]
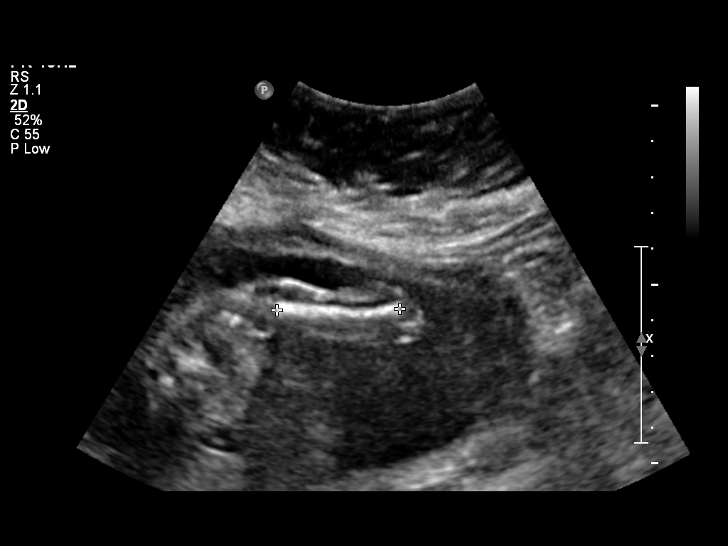
[im 41/47]
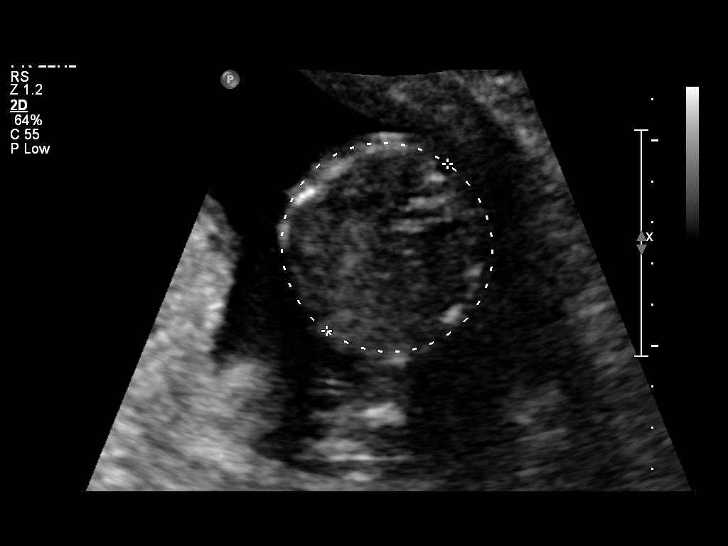
[im 45/47]
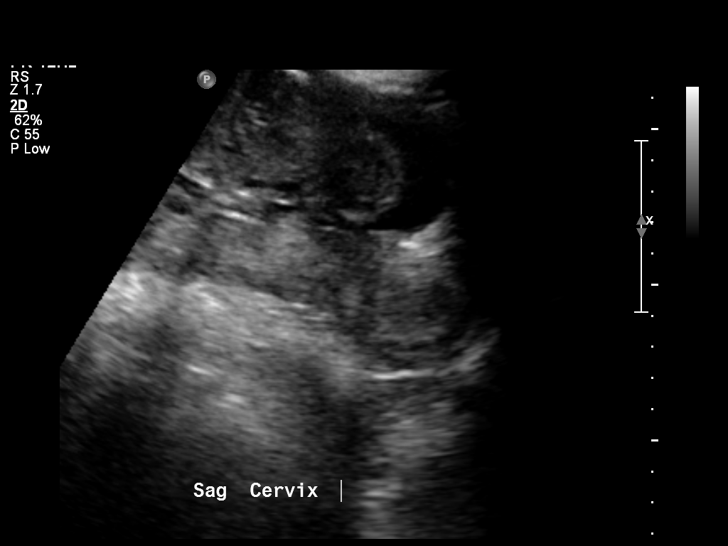

[12 of 28 positions shown; findings below may reference images not displayed]

OBSTETRICS REPORT
                      (Signed Final 10/29/2012 [DATE])

Service(s) Provided

 US OB FOLLOW UP                                       76816.1
Indications

 Advanced maternal age (AMA), Multigravida
 Prior Csection x 3
 Poor obstetric history-Recurrent (habitual) abortion
 (7 ab's)
 Diabetes - Gestational, A1 (diet controlled)
 Follow-up incomplete fetal anatomic evaluation
Fetal Evaluation

 Num Of Fetuses:    1
 Fetal Heart Rate:  152                         bpm
 Cardiac Activity:  Observed
 Presentation:      Breech
 Placenta:          Fundal, above cervical os
 P. Cord            Previously Visualized
 Insertion:

 Amniotic Fluid
 AFI FV:      Subjectively within normal limits
                                             Larg Pckt:     4.6  cm
Biometry

 BPD:     51.8  mm    G. Age:   21w 5d                CI:        71.54   70 - 86
                                                      FL/HC:      18.7   15.9 -

 HC:       195  mm    G. Age:   21w 5d       59  %    HC/AC:      1.22   1.06 -

 AC:     159.3  mm    G. Age:   21w 0d       35  %    FL/BPD:
 FL:      36.5  mm    G. Age:   21w 4d       51  %    FL/AC:      22.9   20 - 24
 HUM:     33.2  mm    G. Age:   21w 1d       46  %
 Est. FW:     418  gm    0 lb 15 oz      46  %
Gestational Age

 LMP:           21w 2d       Date:   06/02/12                 EDD:   03/09/13
 U/S Today:     21w 3d                                        EDD:   03/08/13
 Best:          21w 2d    Det. By:   LMP  (06/02/12)          EDD:   03/09/13
Anatomy

 Cranium:          Appears normal         Aortic Arch:      Not well visualized
 Fetal Cavum:      Previously seen        Ductal Arch:      Previously seen
 Ventricles:       Appears normal         Diaphragm:        Previously seen
 Choroid Plexus:   Previously seen        Stomach:          Appears normal, left
                                                            sided
 Cerebellum:       Previously seen        Abdomen:          Appears normal
 Posterior Fossa:  Previously seen        Abdominal Wall:   Appears nml (cord
                                                            insert, abd wall)
 Nuchal Fold:      Previously seen        Cord Vessels:     Appears normal (3
                                                            vessel cord)
 Face:             Orbits appear          Kidneys:          Appear normal
                   normal
 Lips:             Previously seen        Bladder:          Appears normal
 Heart:            Appears normal         Spine:            Appears normal
                   (4CH, axis, and
                   situs)
 RVOT:             Previously seen        Lower             Previously seen
                                          Extremities:
 LVOT:             Previously seen        Upper             Previously seen
                                          Extremities:

 Other:  Fetus appears to be a male. Heels visualized. Nasal bone visualized.
         Nasal bone visualized. Technically difficult due to maternal habitus
         and fetal position.
Cervix Uterus Adnexa

 Cervical Length:   3.8       cm

 Cervix:       Normal appearance by transabdominal scan.
 Uterus:       No abnormality visualized.
 Left Ovary:   Small left ovarian cyst, incompletely resolved due to
               poor resolution
 Right Ovary:  Within normal limits.
 Adnexa:     No abnormality visualized.
Impression

 Single intrauterine gestation demonstrating an estimated
 gestational age by ultrasound of 21w 3d. This is correlated
 with expected estimated gestational age by LMP of 21w 2d.

 An improved assessment of the fetal spine was possible
 today. Visualized anatomy appears normal with resolution
 diminished by patient habitus.

 Subjectively and quantitatively normal amniotic fluid volume.

 Normal cervical length and appearance.

 A small left ovarian cyst is suspected but can not be fully
 resolved or evaluated due to patient habitus.

 questions or concerns.

## 2016-10-01 ENCOUNTER — Emergency Department (HOSPITAL_COMMUNITY): Payer: BLUE CROSS/BLUE SHIELD

## 2016-10-01 ENCOUNTER — Emergency Department (HOSPITAL_COMMUNITY)
Admission: EM | Admit: 2016-10-01 | Discharge: 2016-10-02 | Disposition: A | Discharge status: Home / Self Care | Payer: BLUE CROSS/BLUE SHIELD | Attending: Emergency Medicine | Admitting: Emergency Medicine

## 2016-10-01 ENCOUNTER — Encounter (HOSPITAL_COMMUNITY): Payer: Self-pay

## 2016-10-01 DIAGNOSIS — E748 Other specified disorders of carbohydrate metabolism: Secondary | ICD-10-CM | POA: Diagnosis not present

## 2016-10-01 DIAGNOSIS — E876 Hypokalemia: Secondary | ICD-10-CM | POA: Diagnosis not present

## 2016-10-01 DIAGNOSIS — I10 Essential (primary) hypertension: Secondary | ICD-10-CM | POA: Diagnosis not present

## 2016-10-01 DIAGNOSIS — R739 Hyperglycemia, unspecified: Secondary | ICD-10-CM | POA: Diagnosis not present

## 2016-10-01 DIAGNOSIS — R42 Dizziness and giddiness: Secondary | ICD-10-CM

## 2016-10-01 DIAGNOSIS — R81 Glycosuria: Secondary | ICD-10-CM

## 2016-10-01 DIAGNOSIS — R55 Syncope and collapse: Secondary | ICD-10-CM | POA: Diagnosis present

## 2016-10-01 LAB — URINALYSIS, ROUTINE W REFLEX MICROSCOPIC
BILIRUBIN URINE: NEGATIVE
Glucose, UA: 50 mg/dL — AB
KETONES UR: NEGATIVE mg/dL
LEUKOCYTES UA: NEGATIVE
Nitrite: NEGATIVE
PROTEIN: NEGATIVE mg/dL
Specific Gravity, Urine: 1.005 (ref 1.005–1.030)
pH: 7 (ref 5.0–8.0)

## 2016-10-01 LAB — BASIC METABOLIC PANEL
Anion gap: 9 (ref 5–15)
BUN: 6 mg/dL (ref 6–20)
CHLORIDE: 106 mmol/L (ref 101–111)
CO2: 23 mmol/L (ref 22–32)
Calcium: 9 mg/dL (ref 8.9–10.3)
Creatinine, Ser: 0.67 mg/dL (ref 0.44–1.00)
GFR calc Af Amer: >60 mL/min (ref >60)
GFR calc non Af Amer: >60 mL/min (ref >60)
Glucose, Bld: 137 mg/dL — ABNORMAL HIGH (ref 65–99)
POTASSIUM: 3.2 mmol/L — AB (ref 3.5–5.1)
SODIUM: 138 mmol/L (ref 135–145)

## 2016-10-01 LAB — CBC
HCT: 40.9 % (ref 36.0–46.0)
Hemoglobin: 14.2 g/dL (ref 12.0–15.0)
MCH: 29.6 pg (ref 26.0–34.0)
MCHC: 34.7 g/dL (ref 30.0–36.0)
MCV: 85.4 fL (ref 78.0–100.0)
Platelets: 215 10*3/uL (ref 150–400)
RBC: 4.79 MIL/uL (ref 3.87–5.11)
RDW: 13.4 % (ref 11.5–15.5)
WBC: 9.5 10*3/uL (ref 4.0–10.5)

## 2016-10-01 LAB — CBG MONITORING, ED: GLUCOSE-CAPILLARY: 142 mg/dL — AB (ref 65–99)

## 2016-10-01 MED ORDER — HYDROCHLOROTHIAZIDE 25 MG PO TABS
25.0000 mg | ORAL_TABLET | Freq: Every day | ORAL | Status: DC
  Administered 2016-10-01: 25 mg via ORAL
  Filled 2016-10-01: qty 1

## 2016-10-01 MED ORDER — HYDROCHLOROTHIAZIDE 25 MG PO TABS: 25.0000 mg | ORAL_TABLET | Freq: Every day | ORAL | 0 refills | Status: AC

## 2016-10-01 MED ORDER — POTASSIUM CHLORIDE ER 20 MEQ PO TBCR: 40.0000 meq | EXTENDED_RELEASE_TABLET | Freq: Every day | ORAL | 0 refills | Status: AC

## 2016-10-01 MED ORDER — POTASSIUM CHLORIDE CRYS ER 20 MEQ PO TBCR
40.0000 meq | EXTENDED_RELEASE_TABLET | Freq: Once | ORAL | Status: AC
  Administered 2016-10-01: 40 meq via ORAL
  Filled 2016-10-01: qty 2

## 2016-10-01 NOTE — ED Triage Notes (Signed)
Pt brought in by [facility]. Pt began to feel like she was going to pass out at around 1900. Pt states she never lost consciousness. Denies dizziness, nausea, diaphoresis, chest pain, and shortness of breath at this time. States that she is asymptomatic when she arrived. Pt in NAD. Hypertensive at 201/114. Pt reports having similar episode in October 2017.

## 2016-10-01 NOTE — ED Provider Notes (Signed)
[facility]-EMERGENCY DEPT Provider Note  CSN: [id number] Arrival date & time: 10/01/16  2124  History   Chief Complaint Chief Complaint  Patient presents with  . Near Syncope   HPI [name] is a 46 y.o. female.  The history is provided by the patient and medical records. No language interpreter was used.  Illness  This is a recurrent problem. The current episode started 1 to 2 hours ago. Episode frequency: Intermittent. The problem has been resolved. Pertinent negatives include no chest pain, no abdominal pain, no headaches and no shortness of breath. Nothing aggravates the symptoms. Nothing relieves the symptoms.   Past Medical History:  Diagnosis Date  . Gestational diabetes    oral meds   . Hypertension     PIH  . Infertility associated with anovulation    Patient Active Problem List   Diagnosis Date Noted  . Diabetes in pregnancy 10/19/2012  . Hypertension in pregnancy, antepartum 08/10/2012  . History of cesarean delivery, currently pregnant 08/10/2012  . History of miscarriage, currently pregnant 08/10/2012  . History of infertility 08/10/2012  . Advanced maternal age (AMA) in pregnancy 08/10/2012   Past Surgical History:  Procedure Laterality Date  . CESAREAN SECTION WITH BILATERAL TUBAL LIGATION N/A 03/02/2013   Procedure: CESAREAN SECTION WITH BILATERAL TUBAL LIGATION;  Surgeon: Lesly Dukes, MD;  Location: [facility];  Service: Obstetrics;  Laterality: N/A;  [place] clips  . CHOLECYSTECTOMY    . DILATION AND CURETTAGE OF UTERUS     x3 for MAB's  . INNER EAR SURGERY    . TONSILLECTOMY AND ADENOIDECTOMY     OB History    Gravida Para Term Preterm AB Living   9 4 4   5 4    SAB TAB Ectopic Multiple Live Births   5       4     Home Medications    Prior to Admission medications   Not on File   Family History Family History  Problem Relation Age of Onset  . Asthma Mother   . COPD Mother   . Congestive Heart Failure Mother   . Hypertension Father   . Cancer  - Other Paternal Grandmother     breast   Social History Social History  Substance Use Topics  . Smoking status: Never Smoker  . Smokeless tobacco: Never Used  . Alcohol use No   Allergies   Patient has no known allergies.  Review of Systems Review of Systems  Respiratory: Negative for cough and shortness of breath.   Cardiovascular: Negative for chest pain.  Gastrointestinal: Negative for abdominal pain.  Neurological: Positive for light-headedness. Negative for tremors, seizures, syncope, facial asymmetry, speech difficulty, weakness, numbness and headaches.  All other systems reviewed and are negative.  Physical Exam Updated Vital Signs BP (!) 203/113 (BP Location: Left Arm)   Pulse 94   Temp 98.5 F (36.9 C) (Oral)   Resp 20   Ht 5' (1.524 m)   Wt 81.6 kg   LMP 08/31/2016   SpO2 100%   BMI 35.15 kg/m   Physical Exam  Constitutional: She is oriented to person, place, and time. No distress.  Middle aged Caucasian female  HENT:  Head: Normocephalic and atraumatic.  Eyes: EOM are normal. Pupils are equal, round, and reactive to light.  Neck: Normal range of motion. Neck supple.  Cardiovascular: Normal rate, regular rhythm and normal heart sounds.   Pulmonary/Chest: Effort normal and breath sounds normal.  Abdominal: Soft. Bowel sounds are normal.  She exhibits no distension. There is no tenderness.  Musculoskeletal: Normal range of motion.  Neurological: She is alert and oriented to person, place, and time. She displays normal reflexes. No cranial nerve deficit or sensory deficit. She exhibits normal muscle tone. Coordination normal.  Cranial nerves II through XII intact, strength 5/5 throughout, sensation grossly normal, coordination normal, speech fluent, gait normal  Skin: Skin is warm and dry. Capillary refill takes less than 2 seconds. She is not diaphoretic.  Nursing note and vitals reviewed.  ED Treatments / Results  Labs (all labs ordered are listed, but  only abnormal results are displayed) Labs Reviewed  BASIC METABOLIC PANEL - Abnormal; Notable for the following:       Result Value   Potassium 3.2 (*)    Glucose, Bld 137 (*)    All other components within normal limits  URINALYSIS, ROUTINE W REFLEX MICROSCOPIC - Abnormal; Notable for the following:    Color, Urine COLORLESS (*)    Glucose, UA 50 (*)    Hgb urine dipstick SMALL (*)    Bacteria, UA RARE (*)    Squamous Epithelial / LPF 0-5 (*)    All other components within normal limits  CBG MONITORING, ED - Abnormal; Notable for the following:    Glucose-Capillary 142 (*)    All other components within normal limits  CBC   EKG  EKG Interpretation  Date/Time:  Tuesday October 01 2016 21:33:08 EST Ventricular Rate:  100 PR Interval:    QRS Duration: 87 QT Interval:  410 QTC Calculation: 529 R Axis:   61 Text Interpretation:  Sinus tachycardia Probable left atrial enlargement Borderline T abnormalities, diffuse leads Prolonged QT interval suspect QT is not as prolonged as calculated Confirmed by MESNER MD, Barbara CowerJASON [phone]([id number]) on 10/01/2016 10:51:19 PM      Radiology Ct Head Wo Contrast  Result Date: 10/01/2016 CLINICAL DATA:  Near syncopal EXAM: CT HEAD WITHOUT CONTRAST TECHNIQUE: Contiguous axial images were obtained from the base of the skull through the vertex without intravenous contrast. COMPARISON:  None. FINDINGS: BRAIN: The ventricles and sulci are normal. No intraparenchymal hemorrhage, mass effect nor midline shift. No acute large vascular territory infarcts. Grey-white matter distinction is maintained. The basal ganglia are unremarkable. No abnormal extra-axial fluid collections. Basal cisterns are not effaced and midline. The brainstem and cerebellar hemispheres are without acute abnormalities. VASCULAR: Unremarkable. SKULL/SOFT TISSUES: No skull fracture. No significant soft tissue swelling. ORBITS/SINUSES: The included ocular globes and orbital contents are normal.The mastoid  air cells are clear. The included paranasal sinuses are well-aerated. OTHER: None. IMPRESSION: No acute intracranial abnormality. Electronically Signed   By: Tollie Ethavid  Kwon M.D.   On: 10/01/2016 23:36   Procedures Procedures (including critical care time)  Medications Ordered in ED Medications  hydrochlorothiazide (HYDRODIURIL) tablet 25 mg (25 mg Oral Given 10/01/16 2222)  potassium chloride SA (K-DUR,KLOR-CON) CR tablet 40 mEq (not administered)   Initial Impression / Assessment and Plan / ED Course  I have reviewed the triage vital signs and the nursing notes.  46 y.o. female with above stated PMHx, HPI, and physical. Symptoms onset this evening while driving. 4 episodes of lightheadedness described as near-syncope. Patient denies syncope, headache, vision changes, slurred speech, weakness, numbness, tingling, chest pain, shortness of breath. Patient has been in her normal health over past week without evidence of fever, chills, cough, nausea, vomiting, diarrhea, urinary symptoms. Patient has experienced 2 episodes of near syncope before the past. Patient does not follow with a primary care  physician on a regular basis and does not take any medications.  Point care glucose 142. UA negative for infection but noting small amount of glucosuria. EKG showing no ST elevation/depression or T-wave inversions but initially noting a QTC of 590. Upon evaluation of EKG QTC does not appear to be prolonged. Repeat EKG showing QTC of 441. Potassium 3.2 - patient provided with oral repletion in the emergency department and prescription for the same. CBC without leukocytosis or acute anemia. Hypertensive on exam - patient given dose of HCTZ and prescription for the same. CT head showing no acute intracranial abnormality.  Laboratory and imaging results were personally reviewed by myself and used in the medical decision making of this patient's treatment and disposition.  Unclear etiology of patient's  lightheadedness. Suspect patient has undiagnosed diabetes. Patient given a referral to a PCP in the area to establish care and undergo screening evaluation.  Pt discharged home in stable condition. Strict ED return precautions dicussed. Pt understands and agrees with the plan and has no further questions or concerns.   Pt care discussed with and followed by my attending, Dr. Marily Memos  Angelina Ok, MD Pager [phone]  Final Clinical Impressions(s) / ED Diagnoses   Final diagnoses:  Lightheadedness  Hyperglycemia  Hypokalemia  Glucosuria  Hypertension   New Prescriptions New Prescriptions   No medications on file     Angelina Ok, MD 10/01/16 2348    Marily Memos, MD 10/02/16 [id number]

## 2016-10-02 NOTE — ED Notes (Signed)
Pt stable, understands discharge instructions, and reasons for return.
